# Patient Record
Sex: Female | Born: 1980 | Race: Asian | Hispanic: No | Marital: Married | Smoking: Former smoker
Health system: Southern US, Community
[De-identification: ages and names within clinical notes are randomized; demographics above are authoritative.]

## PROBLEM LIST (undated history)

## (undated) ENCOUNTER — Inpatient Hospital Stay (HOSPITAL_COMMUNITY): Payer: Self-pay

## (undated) DIAGNOSIS — Z789 Other specified health status: Secondary | ICD-10-CM

## (undated) HISTORY — PX: BREAST LUMPECTOMY: SHX2

---

## 1998-04-20 ENCOUNTER — Inpatient Hospital Stay (HOSPITAL_COMMUNITY): Admission: AD | Admit: 1998-04-20 | Discharge: 1998-04-20 | Payer: Self-pay | Admitting: Obstetrics

## 1998-12-04 ENCOUNTER — Other Ambulatory Visit: Admission: RE | Admit: 1998-12-04 | Discharge: 1998-12-04 | Payer: Self-pay | Admitting: Obstetrics

## 1998-12-04 ENCOUNTER — Ambulatory Visit (HOSPITAL_COMMUNITY): Admission: RE | Admit: 1998-12-04 | Discharge: 1998-12-04 | Payer: Self-pay | Admitting: Obstetrics

## 1999-01-01 ENCOUNTER — Other Ambulatory Visit: Admission: RE | Admit: 1999-01-01 | Discharge: 1999-01-01 | Payer: Self-pay | Admitting: Obstetrics

## 1999-01-01 ENCOUNTER — Inpatient Hospital Stay (HOSPITAL_COMMUNITY): Admission: AD | Admit: 1999-01-01 | Discharge: 1999-01-01 | Payer: Self-pay | Admitting: *Deleted

## 1999-03-24 ENCOUNTER — Inpatient Hospital Stay (HOSPITAL_COMMUNITY): Admission: AD | Admit: 1999-03-24 | Discharge: 1999-03-24 | Payer: Self-pay | Admitting: Obstetrics

## 1999-06-02 ENCOUNTER — Inpatient Hospital Stay (HOSPITAL_COMMUNITY): Admission: AD | Admit: 1999-06-02 | Discharge: 1999-06-02 | Payer: Self-pay | Admitting: Obstetrics

## 1999-06-03 ENCOUNTER — Inpatient Hospital Stay (HOSPITAL_COMMUNITY): Admission: AD | Admit: 1999-06-03 | Discharge: 1999-06-03 | Payer: Self-pay | Admitting: Obstetrics

## 1999-06-15 ENCOUNTER — Inpatient Hospital Stay (HOSPITAL_COMMUNITY): Admission: AD | Admit: 1999-06-15 | Discharge: 1999-06-15 | Payer: Self-pay | Admitting: Obstetrics

## 1999-06-26 ENCOUNTER — Inpatient Hospital Stay (HOSPITAL_COMMUNITY): Admission: AD | Admit: 1999-06-26 | Discharge: 1999-06-28 | Payer: Self-pay | Admitting: Obstetrics

## 1999-11-28 ENCOUNTER — Ambulatory Visit (HOSPITAL_BASED_OUTPATIENT_CLINIC_OR_DEPARTMENT_OTHER): Admission: RE | Admit: 1999-11-28 | Discharge: 1999-11-28 | Payer: Self-pay | Admitting: General Surgery

## 2002-06-27 ENCOUNTER — Encounter (HOSPITAL_BASED_OUTPATIENT_CLINIC_OR_DEPARTMENT_OTHER): Payer: Self-pay | Admitting: General Surgery

## 2002-06-29 ENCOUNTER — Ambulatory Visit (HOSPITAL_COMMUNITY): Admission: RE | Admit: 2002-06-29 | Discharge: 2002-06-29 | Payer: Self-pay | Admitting: General Surgery

## 2002-06-29 ENCOUNTER — Encounter (INDEPENDENT_AMBULATORY_CARE_PROVIDER_SITE_OTHER): Payer: Self-pay | Admitting: *Deleted

## 2004-05-18 ENCOUNTER — Inpatient Hospital Stay (HOSPITAL_COMMUNITY): Admission: AD | Admit: 2004-05-18 | Discharge: 2004-05-18 | Payer: Self-pay | Admitting: Obstetrics

## 2004-09-01 ENCOUNTER — Inpatient Hospital Stay (HOSPITAL_COMMUNITY): Admission: AD | Admit: 2004-09-01 | Discharge: 2004-09-05 | Payer: Self-pay | Admitting: Obstetrics

## 2004-09-02 ENCOUNTER — Encounter (INDEPENDENT_AMBULATORY_CARE_PROVIDER_SITE_OTHER): Payer: Self-pay | Admitting: Specialist

## 2004-09-06 ENCOUNTER — Encounter: Admission: RE | Admit: 2004-09-06 | Discharge: 2004-09-06 | Payer: Self-pay | Admitting: Obstetrics

## 2004-10-07 ENCOUNTER — Encounter: Admission: RE | Admit: 2004-10-07 | Discharge: 2004-11-06 | Payer: Self-pay | Admitting: Obstetrics

## 2004-11-07 ENCOUNTER — Encounter: Admission: RE | Admit: 2004-11-07 | Discharge: 2004-12-07 | Payer: Self-pay | Admitting: Obstetrics

## 2005-01-05 ENCOUNTER — Encounter: Admission: RE | Admit: 2005-01-05 | Discharge: 2005-02-04 | Payer: Self-pay | Admitting: Obstetrics

## 2005-03-07 ENCOUNTER — Encounter: Admission: RE | Admit: 2005-03-07 | Discharge: 2005-04-06 | Payer: Self-pay | Admitting: Obstetrics

## 2005-05-07 ENCOUNTER — Encounter: Admission: RE | Admit: 2005-05-07 | Discharge: 2005-06-06 | Payer: Self-pay | Admitting: Obstetrics

## 2005-06-07 ENCOUNTER — Encounter: Admission: RE | Admit: 2005-06-07 | Discharge: 2005-07-06 | Payer: Self-pay | Admitting: Obstetrics

## 2005-07-07 ENCOUNTER — Encounter: Admission: RE | Admit: 2005-07-07 | Discharge: 2005-08-06 | Payer: Self-pay | Admitting: Obstetrics

## 2005-08-07 ENCOUNTER — Encounter: Admission: RE | Admit: 2005-08-07 | Discharge: 2005-09-05 | Payer: Self-pay | Admitting: Obstetrics

## 2005-09-06 ENCOUNTER — Encounter: Admission: RE | Admit: 2005-09-06 | Discharge: 2005-10-06 | Payer: Self-pay | Admitting: Obstetrics

## 2005-10-07 ENCOUNTER — Encounter: Admission: RE | Admit: 2005-10-07 | Discharge: 2005-11-06 | Payer: Self-pay | Admitting: Obstetrics

## 2005-11-07 ENCOUNTER — Encounter: Admission: RE | Admit: 2005-11-07 | Discharge: 2005-12-04 | Payer: Self-pay | Admitting: Obstetrics

## 2005-12-05 ENCOUNTER — Encounter: Admission: RE | Admit: 2005-12-05 | Discharge: 2006-01-04 | Payer: Self-pay | Admitting: Obstetrics

## 2006-01-05 ENCOUNTER — Encounter: Admission: RE | Admit: 2006-01-05 | Discharge: 2006-02-04 | Payer: Self-pay | Admitting: Obstetrics

## 2008-07-22 ENCOUNTER — Encounter (INDEPENDENT_AMBULATORY_CARE_PROVIDER_SITE_OTHER): Payer: Self-pay | Admitting: Obstetrics

## 2008-07-22 ENCOUNTER — Encounter: Payer: Self-pay | Admitting: Emergency Medicine

## 2008-07-22 ENCOUNTER — Inpatient Hospital Stay (HOSPITAL_COMMUNITY): Admission: AD | Admit: 2008-07-22 | Discharge: 2008-07-23 | Payer: Self-pay | Admitting: Obstetrics

## 2009-09-06 ENCOUNTER — Ambulatory Visit (HOSPITAL_COMMUNITY): Admission: RE | Admit: 2009-09-06 | Discharge: 2009-09-06 | Payer: Self-pay | Admitting: Obstetrics

## 2009-09-11 ENCOUNTER — Inpatient Hospital Stay (HOSPITAL_COMMUNITY): Admission: AD | Admit: 2009-09-11 | Discharge: 2009-09-11 | Payer: Self-pay | Admitting: Obstetrics

## 2009-09-12 ENCOUNTER — Ambulatory Visit (HOSPITAL_COMMUNITY): Admission: RE | Admit: 2009-09-12 | Discharge: 2009-09-12 | Payer: Self-pay | Admitting: Obstetrics

## 2010-01-04 ENCOUNTER — Emergency Department: Payer: Self-pay | Admitting: Emergency Medicine

## 2010-10-25 ENCOUNTER — Other Ambulatory Visit (HOSPITAL_COMMUNITY): Payer: Self-pay | Admitting: Obstetrics

## 2010-10-25 DIAGNOSIS — O09299 Supervision of pregnancy with other poor reproductive or obstetric history, unspecified trimester: Secondary | ICD-10-CM

## 2010-10-26 ENCOUNTER — Encounter: Payer: Self-pay | Admitting: Obstetrics

## 2010-11-17 ENCOUNTER — Ambulatory Visit (HOSPITAL_COMMUNITY)
Admission: RE | Admit: 2010-11-17 | Discharge: 2010-11-17 | Disposition: A | Discharge status: Home / Self Care | Payer: BC Managed Care – PPO | Source: Ambulatory Visit | Attending: Obstetrics | Admitting: Obstetrics

## 2010-11-17 ENCOUNTER — Other Ambulatory Visit (HOSPITAL_COMMUNITY): Payer: Self-pay | Admitting: Obstetrics

## 2010-11-17 DIAGNOSIS — R58 Hemorrhage, not elsewhere classified: Secondary | ICD-10-CM

## 2010-11-17 DIAGNOSIS — O209 Hemorrhage in early pregnancy, unspecified: Secondary | ICD-10-CM | POA: Insufficient documentation

## 2010-11-17 DIAGNOSIS — Z3689 Encounter for other specified antenatal screening: Secondary | ICD-10-CM | POA: Insufficient documentation

## 2010-11-19 ENCOUNTER — Other Ambulatory Visit (HOSPITAL_COMMUNITY): Payer: BC Managed Care – PPO

## 2010-11-19 ENCOUNTER — Other Ambulatory Visit: Payer: Self-pay | Admitting: Obstetrics

## 2010-11-19 ENCOUNTER — Ambulatory Visit (HOSPITAL_COMMUNITY)
Admission: RE | Admit: 2010-11-19 | Discharge: 2010-11-19 | Disposition: A | Discharge status: Home / Self Care | Payer: BC Managed Care – PPO | Source: Ambulatory Visit | Attending: Obstetrics | Admitting: Obstetrics

## 2010-11-19 DIAGNOSIS — O021 Missed abortion: Secondary | ICD-10-CM | POA: Insufficient documentation

## 2010-11-19 LAB — CBC
HCT: 40.1 % (ref 36.0–46.0)
MCH: 30.8 pg (ref 26.0–34.0)
MCV: 90.7 fL (ref 78.0–100.0)
RBC: 4.42 MIL/uL (ref 3.87–5.11)
WBC: 6.5 10*3/uL (ref 4.0–10.5)

## 2010-11-25 ENCOUNTER — Other Ambulatory Visit (HOSPITAL_COMMUNITY): Payer: Self-pay

## 2010-11-25 ENCOUNTER — Encounter (HOSPITAL_COMMUNITY): Payer: Self-pay

## 2011-01-06 LAB — URINALYSIS, ROUTINE W REFLEX MICROSCOPIC
Bilirubin Urine: NEGATIVE
Glucose, UA: NEGATIVE mg/dL
Hgb urine dipstick: NEGATIVE
Specific Gravity, Urine: 1.02 (ref 1.005–1.030)

## 2011-02-04 NOTE — Op Note (Signed)
  Ashley Ware, Ashley Ware                ACCOUNT NO.:  [id number]  MEDICAL RECORD NO.:  [id number]           PATIENT TYPE:  O  LOCATION:  [facility]                          [facility]  PHYSICIAN:  Kathreen Cosier, M.D.DATE OF BIRTH:  [birth year]  DATE OF PROCEDURE:  11/19/2010 DATE OF DISCHARGE:  11/19/2010                              OPERATIVE REPORT   PREOPERATIVE DIAGNOSIS:  8-week intrauterine fetal demise.  POSTOPERATIVE DIAGNOSIS:  8-week intrauterine fetal demise.  PROCEDURE:  D and E using MAC.  The patient in lithotomy position.  The bladder was emptied with a straight catheter and perineum and vagina prepped.  Speculum placed in the vagina and cervix was injected with 10 mL of 1% Xylocaine.  Anterior lip of the cervix grasped with tenaculum and uterus was 10 weeks' size. A #10 suction device was easily placed in the uterine cavity and the contents of the uterus aspirated until the cavity was clean.  The patient tolerated the procedure well, taken to the recovery room in good condition.          ______________________________ Kathreen Cosier, M.D.     BAM/MEDQ  D:  01/28/2011  T:  01/28/2011  Job:  [id number]  Electronically Signed by Francoise Ceo M.D. on 02/04/2011 08:13:33 AM

## 2011-02-17 NOTE — Op Note (Signed)
NAMECLARISSIA, MCKEEN                ACCOUNT NO.:  [id number]   MEDICAL RECORD NO.:  [id number]          PATIENT TYPE:  INP   LOCATION:  9305                          [facility]   PHYSICIAN:  Kathreen Cosier, M.D.DATE OF BIRTH:  10/01/1981   DATE OF PROCEDURE:  07/22/2008  DATE OF DISCHARGE:                               OPERATIVE REPORT   PREOPERATIVE DIAGNOSIS:  Ruptured ectopic pregnancy.   POSTOPERATIVE DIAGNOSIS:  Ruptured left hemorrhagic ovarian cyst, corpus  luteum cyst.   PROCEDURE:  Wedge resection of the left ovary.   Under general anesthesia the patient in lithotomy position, abdomen,  perineum and vagina prepped and draped, bladder emptied with a Foley  catheter.  Transverse suprapubic mini lap was made carried down to the  fascia.  Fascia cleaned and incised length of incision.  Recti muscles  retracted laterally.  Peritoneum incised longitudinally with about 250  mL of free blood and clots in the peritoneal cavity.  The uterus was  soft and normal.  Both tubes were normal.  The right ovary was normal.  The left ovary was surrounded by clots and there was actively bleeding  from a defect in the ovarian wall.  A wedge resection was performed of  the left ovary and sent to pathology.  Hemostasis achieved with  interrupted sutures of 2-0 chromic in deep in the ovary and on the  capsule.  Hemostasis satisfactory.  Lap and sponge counts correct.  Abdomen closed in layers, peritoneum continuous suture of 0 chromic,  fascia continuous suture with Dexon.  Skin closed with subcuticular  stitch of 4-0 Monocryl.           ______________________________  Kathreen Cosier, M.D.     BAM/MEDQ  D:  07/22/2008  T:  07/22/2008  Job:  [id number]

## 2011-02-20 NOTE — Discharge Summary (Signed)
NAMENACOLE, FLUHR NO.:  [id number]   MEDICAL RECORD NO.:  [id number]          PATIENT TYPE:  INP   LOCATION:  9118                          [facility]   PHYSICIAN:  Kathreen Cosier, M.D.DATE OF BIRTH:  May 12, 1981   DATE OF ADMISSION:  09/01/2004  DATE OF DISCHARGE:                                 DISCHARGE SUMMARY   HOSPITAL COURSE:  The patient is a 30 year old gravida 3 para 2-0-0-2 with  [facility] October 14, 2004.  Started contracting 11:30 p.m. prior to admission and  was contracting every 2-3 minutes of good quality.  On admission, because  she was supposedly 34 weeks, she was started on magnesium sulfate 4 g  loading, 2 g per hour.  By 7:30 a.m. the patient stated that she had some  leakage and a speculum exam confirmed ruptured membranes.  The fluid was  clear.  The cervix was 2 cm, 70%, vertex, -3.  The estimated fetal weight  was 6 pounds.  She got betamethasone 12.5 IM x1 and the patient was in  active labor.  Throughout the course of the day she progressed slowly and by  3:40 p.m. on November 29 she was 6 cm, 70%, with the vertex -2 to -3 station  and her labor was augmented with Pitocin.  However, the patient by 9:10 on  the following day had not changed her cervix although she had adequate MVUs.  It was decided that she would be delivered by C-section for failure to  progress in labor, being unchanged for greater than 10 hours with adequate  labor.  She had a low transverse cesarean section and had a female, Apgars 8  and 9, weighing 6 pounds 5 ounces.  Postoperatively, she did well.  Her  hemoglobin was 10.4, and she was discharged home on postoperative day #3  ambulatory, on a regular diet, to see me in 6 weeks.   DISCHARGE DIAGNOSIS:  Status post primary low transverse cesarean section  for failure to progress in labor.      BAM/MEDQ  D:  09/05/2004  T:  09/05/2004  Job:  [id number]

## 2011-02-20 NOTE — Op Note (Signed)
[facility]  Patient:    Ashley Ware, Ashley Ware                         MRN: [id number] Proc. Date: 11/28/99 Adm. Date:  [id number] Attending:  Fortino Sic                           Operative Report  PREOPERATIVE DIAGNOSIS:  Large abscess left axilla.  POSTOPERATIVE DIAGNOSIS:  Large abscess left axilla.  PROCEDURE: Irrigation and debridement large abscess left axilla.  SURGEON:  Marnee Spring. Wiliam Ke, M.D.  ASSISTANT:  None.  ANESTHESIA:  Local MAC.  PROCEDURE:  With the patient well-sedated, the skin of the left axilla was prepped and draped in usual manner.  Tissues were infiltrated with Xylocaine anesthesia. An incision was made over the entire abscess cavity approximately 6 cm long. Lots of pus-like fluid and necrotic tissue was evacuated.  The wound was irrigated with saline and peroxide.  Hemostasis was obtained with electrocautery current.  The  wound was then packed with 1 inch Iodoform gauze and dressing was applied. Estimated blood loss minimal.  Patient received no blood and left the operating  room in stable condition after sponge and needle counts were verified. DD:  11/28/99 TD:  11/29/99 Job: [id number]

## 2011-02-20 NOTE — Op Note (Signed)
Ashley Ware, Ashley Ware                ACCOUNT NO.:  [id number]   MEDICAL RECORD NO.:  [id number]          PATIENT TYPE:  INP   LOCATION:  9118                          [facility]   PHYSICIAN:  Kathreen Cosier, M.D.DATE OF BIRTH:  Jul 23, 1981   DATE OF PROCEDURE:  09/02/2004  DATE OF DISCHARGE:                                 OPERATIVE REPORT   PREOPERATIVE DIAGNOSIS:  Prolonged ruptured membranes with failure to  progress in labor.   POSTOPERATIVE DIAGNOSIS:  Prolonged ruptured membranes with failure to  progress in labor.   SURGEON:  Kathreen Cosier, M.D.   ANESTHESIA:  Epidural.   DESCRIPTION OF PROCEDURE:  The patient was placed on the operating table in  the supine position.  The abdomen was prepped and draped.  The bladder was  emptied with a Foley catheter.   A transverse suprapubic incision was made and carried down to the rectus  fascia.  The fascia was cleaned and incised the length of the incision.  The  recti muscles were retracted laterally.  The peritoneum was incised  longitudinally.  A transverse incision was made in the visceral peritoneum  above the bladder.  The bladder was then mobilized inferiorly.  A transverse  lower uterine incision was made.  The patient was delivered from the OP  position of a female, Apgars 8 and 9, with a loose nuchal cord which was  reduced.  The team was in attendance.  The baby weighted 6 pounds 5 ounces.  Apgars were 8 and 9.  The placenta was posterior and removed manually.  The  uterine cavity was cleaned and dried with a dry lap.  The incision was  closed in one layer with continuous suture of #1 chromic.  Hemostasis was  satisfactory.  The bladder flap was reattached.  The uterus well-contracted.  The tubes and ovaries were normal.  The abdomen was closed in layers.  The  peritoneum with continuous suture of 0 chromic, the fascia with continuous  suture of 0 Dexon, and the skin was closed with subcuticular stitch of  4-0  Monocryl.  Estimated blood loss was 500 cc.      BAM/MEDQ  D:  09/02/2004  T:  09/02/2004  Job:  [id number]

## 2011-02-20 NOTE — Op Note (Signed)
   NAMEJAZZALYNN, Ashley Ware                            ACCOUNT NO.:  [id number]   MEDICAL RECORD NO.:  [id number]                   PATIENT TYPE:  OIB   LOCATION:  2899                                 FACILITY:  [facility]   PHYSICIAN:  Mardene Celeste. Lurene Shadow, M.D.             DATE OF BIRTH:  Apr 06, 1981   DATE OF PROCEDURE:  06/29/2002  DATE OF DISCHARGE:                                 OPERATIVE REPORT   PREOPERATIVE DIAGNOSES:  Hidradenitis suppurativa of right axilla with  abscess.   POSTOPERATIVE DIAGNOSES:  Hidradenitis suppurativa of right axilla with  abscess.   OPERATION PERFORMED:  Excision of hidradenitis suppurativa of right axilla.   SURGEON:  Mardene Celeste. Lurene Shadow, M.D.   ASSISTANT:  Nurse.   ANESTHESIA:  General.   INDICATIONS FOR PROCEDURE:  The patient is a 30 year old woman presenting  with hidradenitis of the right axilla.  She comes to surgery now after  conservative management with antibiotics had been unsuccessful.  She is  aware of the risks and potential benefits of surgery and all questions  answered and consent obtained.   DESCRIPTION OF PROCEDURE:  Following the induction of satisfactory general  anesthesia with the patient positioned supinely, right arm extended  laterally, the right axilla was prepped and draped to be included in a  sterile operative field.  The area around the hidradenitis was encircled  with a large elliptical incision which was then deepened down through the  skin and subcutaneous tissues to dissect the entire axilla with the  underlying subcutaneous margin to remove the area of the hidradenitis.  Hemostasis was then obtained with electrocautery while the specimen was  removed and forwarded for pathologic evaluation.  The subcutaneous tissues  were then closed with a running 3-0 Vicryl suture and skin closed with  running 4-0 Monocryl suture and then reinforced with Steri-Strips.  Sterile  dressings applied.  Anesthetic was reversed and the  patient removed from the  operating room to the recovery room in stable condition, having tolerated  the procedure well.                                                 Mardene Celeste Lurene Shadow, M.D.    PLB/MEDQ  D:  06/29/2002  T:  06/29/2002  Job:  [id number]

## 2011-02-20 NOTE — H&P (Signed)
Ashley Ware, Ashley Ware                ACCOUNT NO.:  [id number]   MEDICAL RECORD NO.:  [id number]          PATIENT TYPE:  INP   LOCATION:  9118                          [facility]   PHYSICIAN:  Kathreen Cosier, M.D.DATE OF BIRTH:  1980-12-12   DATE OF ADMISSION:  09/01/2004  DATE OF DISCHARGE:                                HISTORY & PHYSICAL   HISTORY OF PRESENT ILLNESS:  The patient is a 30 year old gravida 3, para 2-  0-2, with EDC between January 6 and October 14, 2004.  She started  contracting at 11:30 a.m. on the night of August 31, 2004 and came to the  hospital on the morning of September 01, 2004 at which time she was started  on magnesium sulfate, 4 g, loading 2 g per hour.  The patient continued  contracting every two to three minutes.  At 7:30 a.m., speculum exam was  performed because the suspicion of possible ruptured membranes, and ruptured  membranes were confirmed.  The fluid was clear.  The cervix was 2 cm, 70%,  with the vertex at -3 station.  The patient got betamethasone 12.5 IM x1,  and she was in labor.  The estimated fetal weight was 6 pounds clinically.  It was decided that she would continue laboring, and she got Pitocin  stimulation.  An IUPC was inserted, and an epidural by 6:53 p.m. on September 01, 2004.  At 6:53 p.m., the cervix was 4 cm, 70%, and the vertex was at -3.  She was a good pattern __________ greater than 200.  At 3:40 a.m., she was 6  cm, 70%, and the vertex was at -2 to -3 station.  The patient did not make  any more progress.  At 9:10 _________, the cervix was unchanged, and it was  decided that she would deliver by C-section for failure to progress in  labor.   PHYSICAL EXAMINATION:  GENERAL:  Well-developed female in labor.  HEENT:  Negative.  LUNGS:  Clear.  HEART:  Regular rhythm.  No murmurs or gallops.  ABDOMEN:  36-weeks size, estimated fetal weight 6 pounds.  PELVIS:  As described.  EXTREMITIES:  Negative.      BAM/MEDQ  D:  09/02/2004  T:  09/02/2004  Job:  [id number]

## 2011-06-03 ENCOUNTER — Other Ambulatory Visit (HOSPITAL_COMMUNITY): Payer: Self-pay | Admitting: Obstetrics

## 2011-06-03 DIAGNOSIS — O3680X0 Pregnancy with inconclusive fetal viability, not applicable or unspecified: Secondary | ICD-10-CM

## 2011-06-05 ENCOUNTER — Ambulatory Visit (HOSPITAL_COMMUNITY)
Admission: RE | Admit: 2011-06-05 | Discharge: 2011-06-05 | Disposition: A | Discharge status: Home / Self Care | Payer: BC Managed Care – PPO | Source: Ambulatory Visit | Attending: Obstetrics | Admitting: Obstetrics

## 2011-06-05 ENCOUNTER — Ambulatory Visit (HOSPITAL_COMMUNITY): Payer: BC Managed Care – PPO

## 2011-06-05 DIAGNOSIS — Z3689 Encounter for other specified antenatal screening: Secondary | ICD-10-CM | POA: Insufficient documentation

## 2011-06-05 DIAGNOSIS — O09299 Supervision of pregnancy with other poor reproductive or obstetric history, unspecified trimester: Secondary | ICD-10-CM | POA: Insufficient documentation

## 2011-06-05 DIAGNOSIS — O3680X0 Pregnancy with inconclusive fetal viability, not applicable or unspecified: Secondary | ICD-10-CM

## 2011-06-16 DIAGNOSIS — O4700 False labor before 37 completed weeks of gestation, unspecified trimester: Secondary | ICD-10-CM

## 2011-07-06 LAB — CBC
MCHC: 33.8
MCHC: 34.1
MCV: 93.1
MCV: 94.5
RBC: 2.96 — ABNORMAL LOW
RDW: 12.7

## 2011-07-06 LAB — SAMPLE TO BLOOD BANK

## 2011-07-07 LAB — CBC
Hemoglobin: 12.8
MCHC: 34
MCV: 93.2
RBC: 4.04
RDW: 12.2

## 2011-07-07 LAB — URINALYSIS, ROUTINE W REFLEX MICROSCOPIC
Glucose, UA: NEGATIVE
Ketones, ur: 15 — AB
Protein, ur: NEGATIVE
Urobilinogen, UA: 0.2

## 2011-07-07 LAB — APTT: aPTT: 26

## 2011-07-07 LAB — COMPREHENSIVE METABOLIC PANEL
CO2: 21
Calcium: 9.3
Creatinine, Ser: 0.67
GFR calc Af Amer: >60
GFR calc non Af Amer: >60
Glucose, Bld: 120 — ABNORMAL HIGH
Total Protein: 6.6

## 2011-07-07 LAB — PROTIME-INR
INR: 1
Prothrombin Time: 13.8

## 2011-07-07 LAB — DIFFERENTIAL
Lymphocytes Relative: 9 — ABNORMAL LOW
Lymphs Abs: 1.5
Neutrophils Relative %: 88 — ABNORMAL HIGH

## 2011-07-07 LAB — HCG, QUANTITATIVE, PREGNANCY: hCG, Beta Chain, Quant, S: 3300 — ABNORMAL HIGH

## 2011-07-07 LAB — LIPASE, BLOOD: Lipase: 16

## 2011-07-29 ENCOUNTER — Other Ambulatory Visit (HOSPITAL_COMMUNITY): Payer: Self-pay | Admitting: Obstetrics

## 2011-07-29 DIAGNOSIS — Z3689 Encounter for other specified antenatal screening: Secondary | ICD-10-CM

## 2011-07-31 ENCOUNTER — Other Ambulatory Visit (HOSPITAL_COMMUNITY): Payer: Self-pay | Admitting: Obstetrics

## 2011-07-31 ENCOUNTER — Ambulatory Visit (HOSPITAL_COMMUNITY)
Admission: RE | Admit: 2011-07-31 | Discharge: 2011-07-31 | Disposition: A | Discharge status: Home / Self Care | Payer: BC Managed Care – PPO | Source: Ambulatory Visit | Attending: Obstetrics | Admitting: Obstetrics

## 2011-07-31 DIAGNOSIS — Z363 Encounter for antenatal screening for malformations: Secondary | ICD-10-CM | POA: Insufficient documentation

## 2011-07-31 DIAGNOSIS — Z1389 Encounter for screening for other disorder: Secondary | ICD-10-CM | POA: Insufficient documentation

## 2011-07-31 DIAGNOSIS — O358XX0 Maternal care for other (suspected) fetal abnormality and damage, not applicable or unspecified: Secondary | ICD-10-CM | POA: Insufficient documentation

## 2011-07-31 DIAGNOSIS — Z3689 Encounter for other specified antenatal screening: Secondary | ICD-10-CM

## 2011-07-31 DIAGNOSIS — O352XX0 Maternal care for (suspected) hereditary disease in fetus, not applicable or unspecified: Secondary | ICD-10-CM | POA: Insufficient documentation

## 2011-10-06 NOTE — L&D Delivery Note (Signed)
Delivery Note At 5:18 AM a viable female was delivered via Vaginal, Spontaneous Delivery (Presentation: Right Occiput Anterior).  APGAR: 8, 9; weight .   Placenta status: Intact, Manual removal.  Cord: 3 vessels with the following complications: None.  Cord pH: not done  Anesthesia: Epidural  Episiotomy: None Lacerations:  Suture Repair: 2.0 Est. Blood Loss (mL):   Mom to postpartum.  Baby to nursery-stable.  Ashley Ware A 11/11/2011, 5:32 AM

## 2011-10-21 ENCOUNTER — Inpatient Hospital Stay (HOSPITAL_COMMUNITY)
Admission: AD | Admit: 2011-10-21 | Discharge: 2011-10-21 | Disposition: A | Discharge status: Home / Self Care | Payer: Self-pay | Source: Ambulatory Visit | Attending: Obstetrics | Admitting: Obstetrics

## 2011-10-21 ENCOUNTER — Other Ambulatory Visit (HOSPITAL_COMMUNITY): Payer: Self-pay | Admitting: Obstetrics

## 2011-10-21 ENCOUNTER — Encounter (HOSPITAL_COMMUNITY): Payer: Self-pay | Admitting: *Deleted

## 2011-10-21 DIAGNOSIS — O479 False labor, unspecified: Secondary | ICD-10-CM

## 2011-10-21 DIAGNOSIS — O4700 False labor before 37 completed weeks of gestation, unspecified trimester: Secondary | ICD-10-CM | POA: Insufficient documentation

## 2011-10-21 HISTORY — DX: Other specified health status: Z78.9

## 2011-10-21 LAB — URINALYSIS, ROUTINE W REFLEX MICROSCOPIC
Bilirubin Urine: NEGATIVE
Ketones, ur: NEGATIVE mg/dL
Nitrite: NEGATIVE
Protein, ur: NEGATIVE mg/dL
Urobilinogen, UA: 0.2 mg/dL (ref 0.0–1.0)
pH: 6.5 (ref 5.0–8.0)

## 2011-10-21 LAB — WET PREP, GENITAL: Trich, Wet Prep: NONE SEEN

## 2011-10-21 MED ORDER — TERBUTALINE SULFATE 1 MG/ML IJ SOLN
0.2500 mg | Freq: Once | INTRAMUSCULAR | Status: AC
  Administered 2011-10-21: 0.25 mg via SUBCUTANEOUS
  Filled 2011-10-21: qty 1

## 2011-10-21 NOTE — Progress Notes (Signed)
Pt in c/o contractions every 5 minutes since 2030.  Denies any bleeding or leaking of fluid.  Reports some mucus discharge.  + FM.

## 2011-10-21 NOTE — ED Provider Notes (Signed)
History     Chief Complaint  Patient presents with  . Contractions   HPI 31 y.o. [id number] at [redacted]w[redacted]d c/o contractions x 2 hours, q 5 min, no bleeding or LOF. H/O PTB with last pregnancy at 35 weeks.   Past Medical History  Diagnosis Date  . No pertinent past medical history     Past Surgical History  Procedure Date  . Breast lumpectomy   . Cesarean section     Family History  Problem Relation Age of Onset  . Adopted: Yes    History  Substance Use Topics  . Smoking status: Never Smoker   . Smokeless tobacco: Never Used  . Alcohol Use: No    Allergies: No Known Allergies  Prescriptions prior to admission  Medication Sig Dispense Refill  . Prenatal Vit-Fe Fumarate-FA (PRENATAL MULTIVITAMIN) TABS Take 1 tablet by mouth daily.        Review of Systems  Constitutional: Negative.   Respiratory: Negative.   Cardiovascular: Negative.   Gastrointestinal: Negative for nausea, vomiting, abdominal pain, diarrhea and constipation.  Genitourinary: Negative for dysuria, urgency, frequency, hematuria and flank pain.       Negative for vaginal bleeding, Positive for contractions  Musculoskeletal: Negative.   Neurological: Negative.   Psychiatric/Behavioral: Negative.    Physical Exam   Blood pressure 133/80, pulse 113, temperature 98.4 F (36.9 C), resp. rate 18, SpO2 93.00%.  Physical Exam  Nursing note and vitals reviewed. Constitutional: She is oriented to person, place, and time. She appears well-developed and well-nourished. She appears distressed (uncomfortable appearing).  Cardiovascular: Normal rate.   Respiratory: Effort normal.  GI: Soft. There is no tenderness.  Genitourinary: Vaginal discharge (white) found.       SVE: ext os 3/int os 2/long/-3/firm  Neurological: She is alert and oriented to person, place, and time.  Skin: Skin is warm and dry.  Psychiatric: She has a normal mood and affect.    MAU Course  Procedures  Results for orders placed during  the hospital encounter of 10/21/11 (from the past 24 hour(s))  URINALYSIS, ROUTINE W REFLEX MICROSCOPIC     Status: Abnormal   Collection Time   10/21/11  9:37 PM      Component Value Range   Color, Urine YELLOW  YELLOW    APPearance CLEAR  CLEAR    Specific Gravity, Urine 1.015  1.005 - 1.030    pH 6.5  5.0 - 8.0    Glucose, UA 500 (*) NEGATIVE (mg/dL)   Hgb urine dipstick NEGATIVE  NEGATIVE    Bilirubin Urine NEGATIVE  NEGATIVE    Ketones, ur NEGATIVE  NEGATIVE (mg/dL)   Protein, ur NEGATIVE  NEGATIVE (mg/dL)   Urobilinogen, UA 0.2  0.0 - 1.0 (mg/dL)   Nitrite NEGATIVE  NEGATIVE    Leukocytes, UA NEGATIVE  NEGATIVE   FETAL FIBRONECTIN     Status: Normal   Collection Time   10/21/11 10:30 PM      Component Value Range   Fetal Fibronectin NEGATIVE  NEGATIVE   WET PREP, GENITAL     Status: Abnormal   Collection Time   10/21/11 10:30 PM      Component Value Range   Yeast, Wet Prep NONE SEEN  NONE SEEN    Trich, Wet Prep NONE SEEN  NONE SEEN    Clue Cells, Wet Prep NONE SEEN  NONE SEEN    WBC, Wet Prep HPF POC FEW (*) NONE SEEN     Terbutaline 0.25 [place] Assessment and Plan  31 y.o. Z6X0960 at [redacted]w[redacted]d Threatened preterm labor - no signs of active labor, contractions stopped after terb, FFN negative F/U in office as scheduled, precautions rev'd  Kobe Ofallon 10/21/2011, 11:32 PM

## 2011-10-21 NOTE — Progress Notes (Signed)
Pt reports she has been having contractions, have been q 5 minutes x 2 hours. Some pressure. Denies other symptoms.

## 2011-10-22 LAB — GC/CHLAMYDIA PROBE AMP, GENITAL
Chlamydia, DNA Probe: NEGATIVE
GC Probe Amp, Genital: NEGATIVE

## 2011-10-23 ENCOUNTER — Other Ambulatory Visit (HOSPITAL_COMMUNITY): Payer: Self-pay | Admitting: Obstetrics

## 2011-10-23 ENCOUNTER — Ambulatory Visit (HOSPITAL_COMMUNITY)
Admission: RE | Admit: 2011-10-23 | Discharge: 2011-10-23 | Disposition: A | Discharge status: Home / Self Care | Payer: BC Managed Care – PPO | Source: Ambulatory Visit | Attending: Obstetrics | Admitting: Obstetrics

## 2011-10-23 DIAGNOSIS — Z3689 Encounter for other specified antenatal screening: Secondary | ICD-10-CM | POA: Insufficient documentation

## 2011-10-23 DIAGNOSIS — O3660X0 Maternal care for excessive fetal growth, unspecified trimester, not applicable or unspecified: Secondary | ICD-10-CM | POA: Insufficient documentation

## 2011-10-28 ENCOUNTER — Other Ambulatory Visit (HOSPITAL_COMMUNITY): Payer: Self-pay | Admitting: Obstetrics

## 2011-10-30 ENCOUNTER — Inpatient Hospital Stay (HOSPITAL_COMMUNITY)
Admission: AD | Admit: 2011-10-30 | Discharge: 2011-10-30 | Disposition: A | Discharge status: Home / Self Care | Payer: BC Managed Care – PPO | Source: Ambulatory Visit | Attending: Obstetrics | Admitting: Obstetrics

## 2011-10-30 ENCOUNTER — Encounter (HOSPITAL_COMMUNITY): Payer: Self-pay | Admitting: *Deleted

## 2011-10-30 DIAGNOSIS — O4700 False labor before 37 completed weeks of gestation, unspecified trimester: Secondary | ICD-10-CM | POA: Insufficient documentation

## 2011-10-30 DIAGNOSIS — O479 False labor, unspecified: Secondary | ICD-10-CM

## 2011-10-30 MED ORDER — HYDROXYZINE PAMOATE 50 MG PO CAPS: 50.0000 mg | ORAL_CAPSULE | Freq: Three times a day (TID) | ORAL | Status: DC | PRN

## 2011-10-30 NOTE — ED Provider Notes (Signed)
History   Pt presents today c/o ctx. She states she has had similar sx in the past and had a negative fetal fibronectin on 10/21/11. She reports GFM and denies vag dc or bleeding.  Chief Complaint  Patient presents with  . Contractions   HPI  OB History    Grav Para Term Preterm Abortions TAB SAB Ect Mult Living   5 3 2 1 1  0 1 0 0 3      Past Medical History  Diagnosis Date  . No pertinent past medical history     Past Surgical History  Procedure Date  . Breast lumpectomy   . Cesarean section     Family History  Problem Relation Age of Onset  . Adopted: Yes    History  Substance Use Topics  . Smoking status: Never Smoker   . Smokeless tobacco: Never Used  . Alcohol Use: No    Allergies: No Known Allergies  Prescriptions prior to admission  Medication Sig Dispense Refill  . Prenatal Vit-Fe Fumarate-FA (PRENATAL MULTIVITAMIN) TABS Take 1 tablet by mouth daily.        Review of Systems  Constitutional: Negative for fever.  Eyes: Negative for blurred vision and double vision.  Cardiovascular: Negative for chest pain.  Gastrointestinal: Positive for abdominal pain. Negative for nausea, vomiting, diarrhea and constipation.  Genitourinary: Negative for dysuria, urgency, frequency and hematuria.  Neurological: Negative for dizziness and headaches.  Psychiatric/Behavioral: Negative for depression and suicidal ideas.   Physical Exam   Blood pressure 124/73, pulse 100, temperature 97.4 F (36.3 C), temperature source Oral, resp. rate 20, height 5\' 3"  (1.6 m), weight 220 lb (99.791 kg), SpO2 97.00%.  Physical Exam  Nursing note and vitals reviewed. Constitutional: She is oriented to person, place, and time. She appears well-developed and well-nourished. No distress.  HENT:  Head: Normocephalic and atraumatic.  GI: Soft. She exhibits no distension. There is no tenderness. There is no rebound and no guarding.  Genitourinary: No bleeding around the vagina. No vaginal  discharge found.       Cervix unchanged. 2/50/-3. Fetus is vertex.  Neurological: She is alert and oriented to person, place, and time.  Skin: Skin is warm and dry. She is not diaphoretic.  Psychiatric: She has a normal mood and affect. Her behavior is normal. Judgment and thought content normal.    MAU Course  Procedures  NST reactive with some irritability.   Assessment and Plan  Braxton Hicks: pt has had no cervical change and is not actively contracting. She has f/u scheduled. Discussed signs and sx of preterm labor. Discussed diet, activity, risks, and precautions. Reminded of [facility].  Clinton Gallant. Emberlee Sortino III, DrHSc, MPAS, PA-C  10/30/2011, 11:38 AM   Henrietta Hoover, PA 10/30/11 1218

## 2011-10-30 NOTE — Progress Notes (Signed)
Patient states she is having contractions every 4 minutes, no bleeding or leaking and reports good fetal movement.

## 2011-11-07 ENCOUNTER — Inpatient Hospital Stay (HOSPITAL_COMMUNITY)
Admission: AD | Admit: 2011-11-07 | Discharge: 2011-11-08 | Disposition: A | Discharge status: Home / Self Care | Payer: BC Managed Care – PPO | Source: Ambulatory Visit | Attending: Obstetrics | Admitting: Obstetrics

## 2011-11-07 ENCOUNTER — Encounter (HOSPITAL_COMMUNITY): Payer: Self-pay | Admitting: *Deleted

## 2011-11-07 DIAGNOSIS — O479 False labor, unspecified: Secondary | ICD-10-CM

## 2011-11-07 DIAGNOSIS — O4700 False labor before 37 completed weeks of gestation, unspecified trimester: Secondary | ICD-10-CM | POA: Insufficient documentation

## 2011-11-07 NOTE — Progress Notes (Signed)
Pt reports having ctx since 8pm q 3 min. Denies bleeding or leaking at this time. Reports fetal movement less since ctx stared. Pt has history of PTL and delivery.

## 2011-11-07 NOTE — Progress Notes (Signed)
States has been leaking some fld since about 2000 but unsure if is urine or amniotic fld

## 2011-11-07 NOTE — ED Notes (Signed)
Threasa Heads CNM in to see pt. EFM strip reviewed.

## 2011-11-07 NOTE — Progress Notes (Signed)
Pt does not want to walk.

## 2011-11-07 NOTE — ED Notes (Signed)
Pt states has not felt as much FM today as normal

## 2011-11-07 NOTE — Progress Notes (Signed)
Threasa Heads  CNM in to see pt. Spec exam done to r/o srom. Pt tol well

## 2011-11-07 NOTE — Progress Notes (Signed)
To R side 

## 2011-11-07 NOTE — Progress Notes (Signed)
Threasa Heads CNM in to reck pt

## 2011-11-08 MED ORDER — BUTORPHANOL TARTRATE 2 MG/ML IJ SOLN
1.0000 mg | Freq: Once | INTRAMUSCULAR | Status: AC
  Administered 2011-11-08: 1 mg via INTRAMUSCULAR
  Filled 2011-11-08: qty 1

## 2011-11-08 NOTE — Progress Notes (Signed)
Pt requests sve before d/c to home. CNM in to see pt.and reck.

## 2011-11-08 NOTE — Progress Notes (Signed)
Written and verbal d/c instructions given and understanding voiced. Reminded not to drive after taking pain med tonight

## 2011-11-08 NOTE — Progress Notes (Signed)
Pt lives in [place] and not comfortable going home yet. Will walk for an hour and then reck cervix. Threasa Heads CNM in with pt

## 2011-11-10 ENCOUNTER — Encounter (HOSPITAL_COMMUNITY): Payer: Self-pay | Admitting: Anesthesiology

## 2011-11-10 ENCOUNTER — Inpatient Hospital Stay (HOSPITAL_COMMUNITY): Payer: BC Managed Care – PPO | Admitting: Anesthesiology

## 2011-11-10 ENCOUNTER — Inpatient Hospital Stay (HOSPITAL_COMMUNITY)
Admission: AD | Admit: 2011-11-10 | Discharge: 2011-11-13 | DRG: 373 | Disposition: A | Discharge status: Home / Self Care | Payer: BC Managed Care – PPO | Source: Ambulatory Visit | Attending: Obstetrics | Admitting: Obstetrics

## 2011-11-10 ENCOUNTER — Encounter (HOSPITAL_COMMUNITY): Payer: Self-pay | Admitting: *Deleted

## 2011-11-10 LAB — CBC
HCT: 37.5 % (ref 36.0–46.0)
Hemoglobin: 12.3 g/dL (ref 12.0–15.0)
MCH: 29.1 pg (ref 26.0–34.0)
MCV: 88.9 fL (ref 78.0–100.0)
RBC: 4.22 MIL/uL (ref 3.87–5.11)

## 2011-11-10 LAB — STREP B DNA PROBE

## 2011-11-10 MED ORDER — TERBUTALINE SULFATE 1 MG/ML IJ SOLN: 0.2500 mg | Freq: Once | INTRAMUSCULAR | Status: AC | PRN

## 2011-11-10 MED ORDER — LACTATED RINGERS IV SOLN
500.0000 mL | Freq: Once | INTRAVENOUS | Status: AC
  Administered 2011-11-10: 500 mL via INTRAVENOUS

## 2011-11-10 MED ORDER — LIDOCAINE HCL 1.5 % IJ SOLN
INTRAMUSCULAR | Status: DC | PRN
  Administered 2011-11-10 (×2): 5 mL via EPIDURAL

## 2011-11-10 MED ORDER — EPHEDRINE 5 MG/ML INJ: 10.0000 mg | INTRAVENOUS | Status: DC | PRN

## 2011-11-10 MED ORDER — BUTORPHANOL TARTRATE 2 MG/ML IJ SOLN: 1.0000 mg | INTRAMUSCULAR | Status: DC | PRN

## 2011-11-10 MED ORDER — OXYTOCIN 20 UNITS IN LACTATED RINGERS INFUSION - SIMPLE: 125.0000 mL/h | Freq: Once | INTRAVENOUS | Status: DC

## 2011-11-10 MED ORDER — ONDANSETRON HCL 4 MG/2ML IJ SOLN: 4.0000 mg | Freq: Four times a day (QID) | INTRAMUSCULAR | Status: DC | PRN

## 2011-11-10 MED ORDER — LACTATED RINGERS IV SOLN: 500.0000 mL | INTRAVENOUS | Status: DC | PRN

## 2011-11-10 MED ORDER — SODIUM CHLORIDE 0.9 % IV SOLN
2.0000 g | Freq: Four times a day (QID) | INTRAVENOUS | Status: DC
  Administered 2011-11-10: 2 g via INTRAVENOUS
  Filled 2011-11-10 (×4): qty 2000

## 2011-11-10 MED ORDER — OXYCODONE-ACETAMINOPHEN 5-325 MG PO TABS
1.0000 | ORAL_TABLET | ORAL | Status: DC | PRN
  Administered 2011-11-12 – 2011-11-13 (×7): 1 via ORAL
  Filled 2011-11-10 (×6): qty 1

## 2011-11-10 MED ORDER — OXYTOCIN 20 UNITS IN LACTATED RINGERS INFUSION - SIMPLE
1.0000 m[IU]/min | INTRAVENOUS | Status: DC
  Administered 2011-11-10: 1 m[IU]/min via INTRAVENOUS
  Filled 2011-11-10: qty 1000

## 2011-11-10 MED ORDER — PHENYLEPHRINE 40 MCG/ML (10ML) SYRINGE FOR IV PUSH (FOR BLOOD PRESSURE SUPPORT)
80.0000 ug | PREFILLED_SYRINGE | INTRAVENOUS | Status: DC | PRN
  Filled 2011-11-10: qty 5

## 2011-11-10 MED ORDER — IBUPROFEN 600 MG PO TABS: 600.0000 mg | ORAL_TABLET | Freq: Four times a day (QID) | ORAL | Status: DC | PRN

## 2011-11-10 MED ORDER — DIPHENHYDRAMINE HCL 50 MG/ML IJ SOLN: 12.5000 mg | INTRAMUSCULAR | Status: DC | PRN

## 2011-11-10 MED ORDER — OXYTOCIN BOLUS FROM INFUSION
500.0000 mL | Freq: Once | INTRAVENOUS | Status: DC
  Filled 2011-11-10: qty 500

## 2011-11-10 MED ORDER — FENTANYL 2.5 MCG/ML BUPIVACAINE 1/10 % EPIDURAL INFUSION (WH - ANES)
INTRAMUSCULAR | Status: DC | PRN
  Administered 2011-11-10: 14 mL/h via EPIDURAL

## 2011-11-10 MED ORDER — ACETAMINOPHEN 325 MG PO TABS: 650.0000 mg | ORAL_TABLET | ORAL | Status: DC | PRN

## 2011-11-10 MED ORDER — LIDOCAINE HCL (PF) 1 % IJ SOLN
30.0000 mL | INTRAMUSCULAR | Status: DC | PRN
  Filled 2011-11-10: qty 30

## 2011-11-10 MED ORDER — LACTATED RINGERS IV SOLN
INTRAVENOUS | Status: DC
  Administered 2011-11-10: 18:00:00 via INTRAVENOUS
  Administered 2011-11-11: 125 mL/h via INTRAVENOUS

## 2011-11-10 MED ORDER — FLEET ENEMA 7-19 GM/118ML RE ENEM: 1.0000 | ENEMA | RECTAL | Status: DC | PRN

## 2011-11-10 MED ORDER — FENTANYL 2.5 MCG/ML BUPIVACAINE 1/10 % EPIDURAL INFUSION (WH - ANES)
14.0000 mL/h | INTRAMUSCULAR | Status: DC
  Administered 2011-11-11 (×3): 14 mL/h via EPIDURAL
  Filled 2011-11-10 (×5): qty 60

## 2011-11-10 MED ORDER — PHENYLEPHRINE 40 MCG/ML (10ML) SYRINGE FOR IV PUSH (FOR BLOOD PRESSURE SUPPORT)
80.0000 ug | PREFILLED_SYRINGE | INTRAVENOUS | Status: DC | PRN
Start: 2011-11-10 — End: 2011-11-11

## 2011-11-10 MED ORDER — CITRIC ACID-SODIUM CITRATE 334-500 MG/5ML PO SOLN: 30.0000 mL | ORAL | Status: DC | PRN

## 2011-11-10 MED ORDER — EPHEDRINE 5 MG/ML INJ
10.0000 mg | INTRAVENOUS | Status: DC | PRN
  Filled 2011-11-10: qty 4

## 2011-11-10 NOTE — Anesthesia Procedure Notes (Signed)
Epidural Patient location during procedure: OB Start time: 11/10/2011 5:43 AM End time: 11/10/2011 5:48 AM Reason for block: procedure for pain  Staffing Anesthesiologist: Sandrea Hughs Performed by: anesthesiologist   Preanesthetic Checklist Completed: patient identified, site marked, surgical consent, pre-op evaluation, timeout performed, IV checked, risks and benefits discussed and monitors and equipment checked  Epidural Patient position: sitting Prep: site prepped and draped and DuraPrep Patient monitoring: continuous pulse ox and blood pressure Approach: midline Injection technique: LOR air  Needle:  Needle type: Tuohy  Needle gauge: 17 G Needle length: 9 cm Needle insertion depth: 6 cm Catheter type: closed end flexible Catheter size: 19 Gauge Catheter at skin depth: 11 cm Test dose: negative and 1.5% lidocaine  Assessment Sensory level: T8 Events: blood not aspirated, injection not painful, no injection resistance, negative IV test and no paresthesia

## 2011-11-10 NOTE — Progress Notes (Signed)
Pt in for labor eval, reports ucs q4-5 minutes since 0115.  Denies any bleeding or leaking of fluid.  + FM.

## 2011-11-10 NOTE — Anesthesia Preprocedure Evaluation (Signed)
Anesthesia Evaluation  Patient identified by MRN, date of birth, ID band Patient awake    Reviewed: Allergy & Precautions, H&P , Patient's Chart, lab work & pertinent test results  Airway Mallampati: II TM Distance: >3 FB Neck ROM: full    Dental No notable dental hx.    Pulmonary neg pulmonary ROS,    Pulmonary exam normal       Cardiovascular neg cardio ROS     Neuro/Psych Negative Neurological ROS  Negative Psych ROS   GI/Hepatic negative GI ROS, Neg liver ROS,   Endo/Other  Morbid obesity  Renal/GU negative Renal ROS  Genitourinary negative   Musculoskeletal negative musculoskeletal ROS (+)   Abdominal (+) obese,   Peds negative pediatric ROS (+)  Hematology negative hematology ROS (+)   Anesthesia Other Findings   Reproductive/Obstetrics (+) Pregnancy                           Anesthesia Physical Anesthesia Plan  ASA: III  Anesthesia Plan: Epidural   Post-op Pain Management:    Induction:   Airway Management Planned:   Additional Equipment:   Intra-op Plan:   Post-operative Plan:   Informed Consent: I have reviewed the patients History and Physical, chart, labs and discussed the procedure including the risks, benefits and alternatives for the proposed anesthesia with the patient or authorized representative who has indicated his/her understanding and acceptance.     Plan Discussed with:   Anesthesia Plan Comments:         Anesthesia Quick Evaluation  

## 2011-11-10 NOTE — H&P (Signed)
This is Dr. Francoise Ceo dictating the history and physical on  Ashley Ware she's a 31 year old gravida 5 para 2113 13 6 weeks and 2 days her EDC is 12/06/2011 patient came in in labor cervix 4-5 cm 80% and the vertex at -2-3 station GBS unknown she was treated with penicillin membranes ruptured artificially fluid clear Past medical history negative Past surgical history she had a hemorrhagic cyst and laparotomy some years ago Social history negative Family history negative Physical exam HEENT negative Lungs clear to P&A Heart regular rhythm no murmurs no gallops Breasts negative Abdomen 36 weeks as Pelvic as described above Extremities negative

## 2011-11-10 NOTE — Progress Notes (Signed)
Patient ID: Ashley Ware, female   DOB: [birth year], 31 y.o.   MRN: [id number] Cervix is 5-6 cm 85% vertex -2-3 IUPC inserted and she was started low-dose Pitocin

## 2011-11-10 NOTE — Progress Notes (Signed)
Patient states she has been having contractions every 4 minutes. Denies any bleeding or leaking and reports good fetal movement.  

## 2011-11-11 ENCOUNTER — Encounter (HOSPITAL_COMMUNITY): Payer: Self-pay

## 2011-11-11 LAB — RUBELLA SCREEN: Rubella: 15.9 IU/mL — ABNORMAL HIGH

## 2011-11-11 LAB — HEPATITIS B SURFACE ANTIGEN: Hepatitis B Surface Ag: NEGATIVE

## 2011-11-11 MED ORDER — OXYCODONE-ACETAMINOPHEN 5-325 MG PO TABS
1.0000 | ORAL_TABLET | ORAL | Status: DC | PRN
  Filled 2011-11-11: qty 1

## 2011-11-11 MED ORDER — BENZOCAINE-MENTHOL 20-0.5 % EX AERO
INHALATION_SPRAY | CUTANEOUS | Status: AC
  Filled 2011-11-11: qty 56

## 2011-11-11 MED ORDER — ZOLPIDEM TARTRATE 5 MG PO TABS: 5.0000 mg | ORAL_TABLET | Freq: Every evening | ORAL | Status: DC | PRN

## 2011-11-11 MED ORDER — PRENATAL MULTIVITAMIN CH
1.0000 | ORAL_TABLET | Freq: Every day | ORAL | Status: DC
  Administered 2011-11-11 – 2011-11-13 (×3): 1 via ORAL
  Filled 2011-11-11 (×3): qty 1

## 2011-11-11 MED ORDER — SENNOSIDES-DOCUSATE SODIUM 8.6-50 MG PO TABS
2.0000 | ORAL_TABLET | Freq: Every day | ORAL | Status: DC
  Administered 2011-11-11 – 2011-11-12 (×2): 2 via ORAL

## 2011-11-11 MED ORDER — ONDANSETRON HCL 4 MG/2ML IJ SOLN: 4.0000 mg | INTRAMUSCULAR | Status: DC | PRN

## 2011-11-11 MED ORDER — IBUPROFEN 600 MG PO TABS
600.0000 mg | ORAL_TABLET | Freq: Four times a day (QID) | ORAL | Status: DC
  Administered 2011-11-11 – 2011-11-13 (×8): 600 mg via ORAL
  Filled 2011-11-11 (×11): qty 1

## 2011-11-11 MED ORDER — FERROUS SULFATE 325 (65 FE) MG PO TABS
325.0000 mg | ORAL_TABLET | Freq: Two times a day (BID) | ORAL | Status: DC
  Administered 2011-11-11 – 2011-11-13 (×5): 325 mg via ORAL
  Filled 2011-11-11 (×5): qty 1

## 2011-11-11 MED ORDER — TETANUS-DIPHTH-ACELL PERTUSSIS 5-2.5-18.5 LF-MCG/0.5 IM SUSP
0.5000 mL | Freq: Once | INTRAMUSCULAR | Status: AC
  Administered 2011-11-12: 0.5 mL via INTRAMUSCULAR
  Filled 2011-11-11: qty 0.5

## 2011-11-11 MED ORDER — LANOLIN HYDROUS EX OINT: TOPICAL_OINTMENT | CUTANEOUS | Status: DC | PRN

## 2011-11-11 MED ORDER — DIPHENHYDRAMINE HCL 25 MG PO CAPS: 25.0000 mg | ORAL_CAPSULE | Freq: Four times a day (QID) | ORAL | Status: DC | PRN

## 2011-11-11 MED ORDER — BENZOCAINE-MENTHOL 20-0.5 % EX AERO
1.0000 "application " | INHALATION_SPRAY | CUTANEOUS | Status: DC | PRN
  Administered 2011-11-11: 1 via TOPICAL

## 2011-11-11 MED ORDER — WITCH HAZEL-GLYCERIN EX PADS: 1.0000 "application " | MEDICATED_PAD | CUTANEOUS | Status: DC | PRN

## 2011-11-11 MED ORDER — SIMETHICONE 80 MG PO CHEW: 80.0000 mg | CHEWABLE_TABLET | ORAL | Status: DC | PRN

## 2011-11-11 MED ORDER — DIBUCAINE 1 % RE OINT: 1.0000 "application " | TOPICAL_OINTMENT | RECTAL | Status: DC | PRN

## 2011-11-11 MED ORDER — ONDANSETRON HCL 4 MG PO TABS: 4.0000 mg | ORAL_TABLET | ORAL | Status: DC | PRN

## 2011-11-11 NOTE — Anesthesia Postprocedure Evaluation (Signed)
  Anesthesia Post-op Note  Patient: Ashley Ware  Procedure(s) Performed: * No procedures listed *  Patient Location: Mother/Baby  Anesthesia Type: Epidural  Level of Consciousness: alert  and oriented  Airway and Oxygen Therapy: Patient Spontanous Breathing  Post-op Pain: mild  Post-op Assessment: Patient's Cardiovascular Status Stable and Respiratory Function Stable  Post-op Vital Signs: stable  Complications: No apparent anesthesia complications

## 2011-11-11 NOTE — Progress Notes (Signed)
Dr. Gaynell Face phoned to check patient status.  Informed of recent SVE, UC's, MVUs, and interventions.  No new orders received at this time.

## 2011-11-11 NOTE — Anesthesia Postprocedure Evaluation (Signed)
Anesthesia Post Note  Patient: Ashley Ware  Procedure(s) Performed: * No procedures listed *  Anesthesia type: Epidural  Patient location: Mother/Baby  Post pain: Pain level controlled  Post assessment: Post-op Vital signs reviewed  Last Vitals:  Filed Vitals:   11/11/11 0646  BP: 128/58  Pulse: 91  Temp:   Resp: 18    Post vital signs: Reviewed  Level of consciousness: awake  Complications: No apparent anesthesia complications

## 2011-11-11 NOTE — Addendum Note (Signed)
Addendum  created 11/11/11 1222 by Fanny Dance, CRNA   Modules edited:Charges VN, Notes Section

## 2011-11-11 NOTE — Progress Notes (Signed)
Dr. Gaynell Face called to check on patient UCs.  Advised of MVU's and current pitocin rate.  No new orders received.

## 2011-11-11 NOTE — Progress Notes (Signed)
Pt. To room 144 and report given to [facility].  Pt. With no complaints at this time.  Family at side.

## 2011-11-12 LAB — CBC
Platelets: 190 10*3/uL (ref 150–400)
RBC: 3.94 MIL/uL (ref 3.87–5.11)
RDW: 12.7 % (ref 11.5–15.5)
WBC: 8.9 10*3/uL (ref 4.0–10.5)

## 2011-11-12 NOTE — Progress Notes (Signed)
Patient ID: Ashley Ware, female   DOB: 1981-04-14, 31 y.o.   MRN: [id number] Postpartum day one Vital signs normal Fundus firm Legs negative Him  Next let complaints and and

## 2011-11-13 ENCOUNTER — Ambulatory Visit (HOSPITAL_COMMUNITY): Payer: BC Managed Care – PPO

## 2011-11-13 ENCOUNTER — Encounter (HOSPITAL_COMMUNITY)
Admit: 2011-11-13 | Discharge: 2011-11-13 | Disposition: A | Discharge status: Home / Self Care | Payer: BC Managed Care – PPO | Attending: Obstetrics | Admitting: Obstetrics

## 2011-11-13 DIAGNOSIS — O923 Agalactia: Secondary | ICD-10-CM | POA: Insufficient documentation

## 2011-11-13 MED ORDER — BENZOCAINE-MENTHOL 20-0.5 % EX AERO
INHALATION_SPRAY | CUTANEOUS | Status: AC
  Filled 2011-11-13: qty 56

## 2011-11-13 NOTE — Discharge Summary (Signed)
Obstetric Discharge Summary Reason for Admission: onset of labor Prenatal Procedures: none Intrapartum Procedures: spontaneous vaginal delivery Postpartum Procedures: none Complications-Operative and Postpartum: none Hemoglobin  Date Value Range Status  11/12/2011 11.3* 12.0-15.0 (g/dL) Final     HCT  Date Value Range Status  11/12/2011 35.4* 36.0-46.0 (%) Final    Discharge Diagnoses: Term Pregnancy-delivered  Discharge Information: Date: 11/13/2011 Activity: pelvic rest Diet: routine Medications: Percocet Condition: stable Instructions: refer to practice specific booklet Discharge to: home Follow-up Information    Follow up with [name], MD. Call in 6 weeks.   Contact information:   [address] [place] [address] [phone]          Newborn Data: Live born female  Birth Weight: 6 lb 15 oz (3147 g) APGAR: 8, 9  Home with mother.  [name] 11/13/2011, 6:33 AM

## 2011-11-13 NOTE — Progress Notes (Signed)
SW received message from FOB stating that he wasn't here when SW met with MOB and asked if there was anything he could do to "help."  SW attempted to return his call, but he did not answer and there was no option to leave a message.  SW to try again at a later time. 

## 2011-11-13 NOTE — Progress Notes (Signed)
SW saw FOB coming out of MOB's room and stopped to talk with him.  He was incredibly polite and friendly and asked SW to come in.  He states he was not here yesterday because he was dealing with getting baby added to his insurance.  SW informed him that this was not a problem at all and although SW would prefer to meet with both parents initially, it is often hard to catch NICU parents at the same time.  He was very understanding.  Both parents were appreciative of gas card and state no other needs or questions at this time.  SW asked them to please let SW know if there is anything SW can do to support and assist them during baby's NICU stay. 

## 2011-11-13 NOTE — Progress Notes (Signed)
I first met [name] and her family while rounding in the NICU.  They are concerned about the baby's health, and hopeful that they may be able to take him home on Sunday.  The time in the NICU was unexpected for them and it is bringing up memories of losing their daughter after 26 weeks in utero.  They have called baby [name] their "miracle baby."  I offered emotional support, prayer and pastoral presence.  [employer] Pager, [phone] 3:24 PM   11/13/11 1500  Clinical Encounter Type  Visited With Patient and family together  Visit Type Spiritual support  Spiritual Encounters  Spiritual Needs Prayer;Emotional  Stress Factors  Patient Stress Factors Other (Comment) (Concern about baby's health)

## 2011-11-13 NOTE — Progress Notes (Signed)
PSYCHOSOCIAL ASSESSMENT ~ MATERNAL/CHILD Name: Ashley Ware                                                                                                       Age: 31   Referral Date: 11/12/11   Reason/Source: NICU Support/NICU  I. FAMILY/HOME ENVIRONMENT A. Child's Legal Guardian _x__Parent(s) ___Grandparent ___Foster parent ___DSS_________________ Name: Ashley Ware                                         DOB: Jun 20, 1981          Age: 54  Address: [address] [place] [address]  Name: Ashley Ware                                 DOB: //                     Age:   Address: same  B. Other Household Members/Support Persons Name:                                         Relationship: sister (62)                   Name:                                         Relationship: sister (9)                   Name:                                         Relationship: sister (7)   C. Other Support: MOB states good support system.  Paternal aunt in room with her today.   II. PSYCHOSOCIAL DATA A. Information Source                                                                                             _x_Patient Interview  _x_Family Interview           _x_Other: chart  B. Event organiser _x_Employment: MOB works at [employer], FOB works at [employer] __Medicaid    [place]:  _x_Private Insurance: BCBS                 __Self Pay  __Food Stamps   __WIC __[employer] First     __Public Housing     __Section 8    __Maternity Care Coordination/Child Service Coordination/Early Intervention  __School:                                                                         Grade:  __Other:   Salena Saner Cultural and Environment Information Cultural Issues Impacting Care: none known  III. STRENGTHS _x__Supportive family/friends _x__Adequate Resources _x__Compliance with medical plan _x__Home prepared for Child (including basic supplies) _x__Understanding of  illness      ___Other: IV. RISK FACTORS AND CURRENT PROBLEMS         __x__No Problems Noted                                                                                                                                                                                                                                       Pt              Family     Substance Abuse                                                                ___              ___        Mental Illness                                                                        ___  ___  Family/Relationship Issues                                      ___               ___             Abuse/Neglect/Domestic Violence                                         ___         ___  Financial Resources                                        ___              ___             Transportation                                                                        ___               ___  DSS Involvement                                                                   ___              ___  Adjustment to Illness                                                               ___              ___  Knowledge/Cognitive Deficit                                                   ___              ___             Compliance with Treatment                                                 ___              ___  Basic Needs (food, housing, etc.)  ___              ___             Housing Concerns                                       ___              ___ Other_____________________________________________________________            V. SOCIAL WORK ASSESSMENT SW met with MOB in her first floor room to introduce myself, complete assessment and evaluate how family is coping with baby's admission to NICU.  MOB was resting, but asked SW to come in.  FOB's sister was visiting with her and she said we could talk with her present.  MOB  states she and baby are doing well.  She seems to have a very good understanding of the situation and appears to be coping well.  She reports having a good support system and most everything she needs for baby at home.  She states they have the means to get the rest of the baby supplies before baby is discharged.  She expects her day of discharge will be emotional since he will have to stay in NICU.  SW validated her feelings and asked if she will have any issues with transportation.  She states that she lives more than 30 minutes away and will not have issues, but it will not be easy with the price of gas and other children at home.  SW offered a gas card and she was appreciative.  She states that her children's grandmother is caring for them and will continue to help.  MOB states no questions or needs at this time.  SW explained support services offered by NICU SWs and gave contact information.    VI. SOCIAL WORK PLAN  ___No Further Intervention Required/No Barriers to Discharge   _x__Psychosocial Support and Ongoing Assessment of Needs   ___Patient/Family Education:   ___Child Protective Services Report   County___________ Date___/____/____   ___Information/Referral to [employer]_________________________   ___Other:

## 2011-12-13 ENCOUNTER — Encounter (HOSPITAL_COMMUNITY)
Admission: RE | Admit: 2011-12-13 | Discharge: 2011-12-13 | Disposition: A | Discharge status: Home / Self Care | Payer: BC Managed Care – PPO | Source: Ambulatory Visit | Attending: Obstetrics | Admitting: Obstetrics

## 2011-12-13 DIAGNOSIS — O923 Agalactia: Secondary | ICD-10-CM | POA: Insufficient documentation

## 2011-12-23 ENCOUNTER — Other Ambulatory Visit: Payer: Self-pay | Admitting: Obstetrics

## 2012-01-11 ENCOUNTER — Encounter (HOSPITAL_COMMUNITY): Payer: Self-pay | Admitting: Pharmacist

## 2012-01-13 ENCOUNTER — Encounter (HOSPITAL_COMMUNITY)
Admission: RE | Admit: 2012-01-13 | Discharge: 2012-01-13 | Disposition: A | Discharge status: Home / Self Care | Payer: BC Managed Care – PPO | Source: Ambulatory Visit | Attending: Obstetrics | Admitting: Obstetrics

## 2012-01-13 DIAGNOSIS — O923 Agalactia: Secondary | ICD-10-CM | POA: Insufficient documentation

## 2012-01-20 ENCOUNTER — Encounter (HOSPITAL_COMMUNITY): Admission: RE | Payer: Self-pay | Source: Ambulatory Visit

## 2012-01-20 ENCOUNTER — Ambulatory Visit (HOSPITAL_COMMUNITY): Admission: RE | Admit: 2012-01-20 | Payer: BC Managed Care – PPO | Source: Ambulatory Visit | Admitting: Obstetrics

## 2012-01-20 SURGERY — LIGATION, FALLOPIAN TUBE, LAPAROSCOPIC
Anesthesia: General | Laterality: Bilateral

## 2012-02-13 ENCOUNTER — Encounter (HOSPITAL_COMMUNITY)
Admission: RE | Admit: 2012-02-13 | Discharge: 2012-02-13 | Disposition: A | Discharge status: Home / Self Care | Payer: BC Managed Care – PPO | Source: Ambulatory Visit | Attending: Obstetrics | Admitting: Obstetrics

## 2012-02-13 DIAGNOSIS — O923 Agalactia: Secondary | ICD-10-CM | POA: Insufficient documentation

## 2012-03-15 ENCOUNTER — Encounter (HOSPITAL_COMMUNITY)
Admission: RE | Admit: 2012-03-15 | Discharge: 2012-03-15 | Disposition: A | Discharge status: Home / Self Care | Payer: BC Managed Care – PPO | Source: Ambulatory Visit | Attending: Obstetrics | Admitting: Obstetrics

## 2012-03-15 DIAGNOSIS — O923 Agalactia: Secondary | ICD-10-CM | POA: Insufficient documentation

## 2012-04-15 ENCOUNTER — Encounter (HOSPITAL_COMMUNITY)
Admission: RE | Admit: 2012-04-15 | Discharge: 2012-04-15 | Disposition: A | Discharge status: Home / Self Care | Payer: BC Managed Care – PPO | Source: Ambulatory Visit | Attending: Obstetrics | Admitting: Obstetrics

## 2012-04-15 DIAGNOSIS — O923 Agalactia: Secondary | ICD-10-CM | POA: Insufficient documentation

## 2012-05-16 ENCOUNTER — Encounter (HOSPITAL_COMMUNITY)
Admission: RE | Admit: 2012-05-16 | Discharge: 2012-05-16 | Disposition: A | Discharge status: Home / Self Care | Payer: BC Managed Care – PPO | Source: Ambulatory Visit | Attending: Obstetrics | Admitting: Obstetrics

## 2012-05-16 DIAGNOSIS — O923 Agalactia: Secondary | ICD-10-CM | POA: Insufficient documentation

## 2012-06-16 ENCOUNTER — Encounter (HOSPITAL_COMMUNITY)
Admission: RE | Admit: 2012-06-16 | Discharge: 2012-06-16 | Disposition: A | Discharge status: Home / Self Care | Payer: BC Managed Care – PPO | Source: Ambulatory Visit | Attending: Obstetrics | Admitting: Obstetrics

## 2012-06-16 DIAGNOSIS — O923 Agalactia: Secondary | ICD-10-CM | POA: Insufficient documentation

## 2013-12-13 ENCOUNTER — Encounter (HOSPITAL_COMMUNITY): Payer: Self-pay

## 2013-12-13 ENCOUNTER — Inpatient Hospital Stay (HOSPITAL_COMMUNITY): Payer: BC Managed Care – PPO

## 2013-12-13 ENCOUNTER — Inpatient Hospital Stay (HOSPITAL_COMMUNITY)
Admission: AD | Admit: 2013-12-13 | Discharge: 2013-12-13 | Disposition: A | Discharge status: Home / Self Care | Payer: BC Managed Care – PPO | Source: Ambulatory Visit | Attending: Obstetrics & Gynecology | Admitting: Obstetrics & Gynecology

## 2013-12-13 DIAGNOSIS — O4690 Antepartum hemorrhage, unspecified, unspecified trimester: Secondary | ICD-10-CM

## 2013-12-13 DIAGNOSIS — O99891 Other specified diseases and conditions complicating pregnancy: Secondary | ICD-10-CM | POA: Insufficient documentation

## 2013-12-13 DIAGNOSIS — R197 Diarrhea, unspecified: Secondary | ICD-10-CM | POA: Insufficient documentation

## 2013-12-13 DIAGNOSIS — R109 Unspecified abdominal pain: Secondary | ICD-10-CM | POA: Insufficient documentation

## 2013-12-13 DIAGNOSIS — O9989 Other specified diseases and conditions complicating pregnancy, childbirth and the puerperium: Principal | ICD-10-CM

## 2013-12-13 DIAGNOSIS — O209 Hemorrhage in early pregnancy, unspecified: Secondary | ICD-10-CM

## 2013-12-13 LAB — URINALYSIS, ROUTINE W REFLEX MICROSCOPIC
Bilirubin Urine: NEGATIVE
GLUCOSE, UA: NEGATIVE mg/dL
KETONES UR: NEGATIVE mg/dL
Leukocytes, UA: NEGATIVE
Nitrite: NEGATIVE
PROTEIN: NEGATIVE mg/dL
Specific Gravity, Urine: >1.03 — ABNORMAL HIGH (ref 1.005–1.030)
Urobilinogen, UA: 0.2 mg/dL (ref 0.0–1.0)
pH: 6 (ref 5.0–8.0)

## 2013-12-13 LAB — CBC
HEMATOCRIT: 37.9 % (ref 36.0–46.0)
Hemoglobin: 13.3 g/dL (ref 12.0–15.0)
MCH: 31.1 pg (ref 26.0–34.0)
MCHC: 35.1 g/dL (ref 30.0–36.0)
MCV: 88.6 fL (ref 78.0–100.0)
Platelets: 222 10*3/uL (ref 150–400)
RBC: 4.28 MIL/uL (ref 3.87–5.11)
RDW: 12.5 % (ref 11.5–15.5)
WBC: 9.3 10*3/uL (ref 4.0–10.5)

## 2013-12-13 LAB — URINE MICROSCOPIC-ADD ON

## 2013-12-13 LAB — WET PREP, GENITAL
TRICH WET PREP: NONE SEEN
YEAST WET PREP: NONE SEEN

## 2013-12-13 LAB — POCT PREGNANCY, URINE: PREG TEST UR: POSITIVE — AB

## 2013-12-13 LAB — HCG, QUANTITATIVE, PREGNANCY: HCG, BETA CHAIN, QUANT, S: 81458 m[IU]/mL — AB (ref <5)

## 2013-12-13 MED ORDER — METRONIDAZOLE 500 MG PO TABS: 500.0000 mg | ORAL_TABLET | Freq: Two times a day (BID) | ORAL | Status: AC

## 2013-12-13 NOTE — Discharge Instructions (Signed)
Pregnancy - First Trimester  During sexual intercourse, millions of sperm go into the vagina. Only 1 sperm will penetrate and fertilize the female egg while it is in the Fallopian tube. One week later, the fertilized egg implants into the wall of the uterus. An embryo begins to develop into a baby. At 6 to 8 weeks, the eyes and face are formed and the heartbeat can be seen on ultrasound. At the end of 12 weeks (first trimester), all the baby's organs are formed. Now that you are pregnant, you will want to do everything you can to have a healthy baby. Two of the most important things are to get good prenatal care and follow your caregiver's instructions. Prenatal care is all the medical care you receive before the baby's birth. It is given to prevent, find, and treat problems during the pregnancy and childbirth.  PRENATAL EXAMS  · During prenatal visits, your weight, blood pressure, and urine are checked. This is done to make sure you are healthy and progressing normally during the pregnancy.  · A pregnant woman should gain 25 to 35 pounds during the pregnancy. However, if you are overweight or underweight, your caregiver will advise you regarding your weight.  · Your caregiver will ask and answer questions for you.  · Blood work, cervical cultures, other necessary tests, and a Pap test are done during your prenatal exams. These tests are done to check on your health and the probable health of your baby. Tests are strongly recommended and done for HIV with your permission. This is the virus that causes AIDS. These tests are done because medicines can be given to help prevent your baby from being born with this infection should you have been infected without knowing it. Blood work is also used to find out your blood type, previous infections, and follow your blood levels (hemoglobin).  · Low hemoglobin (anemia) is common during pregnancy. Iron and vitamins are given to help prevent this. Later in the pregnancy, blood  tests for diabetes will be done along with any other tests if any problems develop.  · You may need other tests to make sure you and the baby are doing well.  CHANGES DURING THE FIRST TRIMESTER   Your body goes through many changes during pregnancy. They vary from person to person. Talk to your caregiver about changes you notice and are concerned about. Changes can include:  · Your menstrual period stops.  · The egg and sperm carry the genes that determine what you look like. Genes from you and your partner are forming a baby. The female genes determine whether the baby is a boy or a girl.  · Your body increases in girth and you may feel bloated.  · Feeling sick to your stomach (nauseous) and throwing up (vomiting). If the vomiting is uncontrollable, call your caregiver.  · Your breasts will begin to enlarge and become tender.  · Your nipples may stick out more and become darker.  · The need to urinate more. Painful urination may mean you have a bladder infection.  · Tiring easily.  · Loss of appetite.  · Cravings for certain kinds of food.  · At first, you may gain or lose a couple of pounds.  · You may have changes in your emotions from day to day (excited to be pregnant or concerned something may go wrong with the pregnancy and baby).  · You may have more vivid and strange dreams.  HOME CARE INSTRUCTIONS   ·   It is very important to avoid all smoking, alcohol and non-prescribed drugs during your pregnancy. These affect the formation and growth of the baby. Avoid chemicals while pregnant to ensure the delivery of a healthy infant.  · Start your prenatal visits by the 12th week of pregnancy. They are usually scheduled monthly at first, then more often in the last 2 months before delivery. Keep your caregiver's appointments. Follow your caregiver's instructions regarding medicine use, blood and lab tests, exercise, and diet.  · During pregnancy, you are providing food for you and your baby. Eat regular, well-balanced  meals. Choose foods such as meat, fish, milk and other low fat dairy products, vegetables, fruits, and whole-grain breads and cereals. Your caregiver will tell you of the ideal weight gain.  · You can help morning sickness by keeping soda crackers at the bedside. Eat a couple before arising in the morning. You may want to use the crackers without salt on them.  · Eating 4 to 5 small meals rather than 3 large meals a day also may help the nausea and vomiting.  · Drinking liquids between meals instead of during meals also seems to help nausea and vomiting.  · A physical sexual relationship may be continued throughout pregnancy if there are no other problems. Problems may be early (premature) leaking of amniotic fluid from the membranes, vaginal bleeding, or belly (abdominal) pain.  · Exercise regularly if there are no restrictions. Check with your caregiver or physical therapist if you are unsure of the safety of some of your exercises. Greater weight gain will occur in the last 2 trimesters of pregnancy. Exercising will help:  · Control your weight.  · Keep you in shape.  · Prepare you for labor and delivery.  · Help you lose your pregnancy weight after you deliver your baby.  · Wear a good support or jogging bra for breast tenderness during pregnancy. This may help if worn during sleep too.  · Ask when prenatal classes are available. Begin classes when they are offered.  · Do not use hot tubs, steam rooms, or saunas.  · Wear your seat belt when driving. This protects you and your baby if you are in an accident.  · Avoid raw meat, uncooked cheese, cat litter boxes, and soil used by cats throughout the pregnancy. These carry germs that can cause birth defects in the baby.  · The first trimester is a good time to visit your dentist for your dental health. Getting your teeth cleaned is okay. Use a softer toothbrush and brush gently during pregnancy.  · Ask for help if you have financial, counseling, or nutritional needs  during pregnancy. Your caregiver will be able to offer counseling for these needs as well as refer you for other special needs.  · Do not take any medicines or herbs unless told by your caregiver.  · Inform your caregiver if there is any mental or physical domestic violence.  · Make a list of emergency phone numbers of family, friends, hospital, and police and fire departments.  · Write down your questions. Take them to your prenatal visit.  · Do not douche.  · Do not cross your legs.  · If you have to stand for long periods of time, rotate you feet or take small steps in a circle.  · You may have more vaginal secretions that may require a sanitary pad. Do not use tampons or scented sanitary pads.  MEDICINES AND DRUG USE IN PREGNANCY  ·   Take prenatal vitamins as directed. The vitamin should contain 1 milligram of folic acid. Keep all vitamins out of reach of children. Only a couple vitamins or tablets containing iron may be fatal to a baby or young child when ingested.  · Avoid use of all medicines, including herbs, over-the-counter medicines, not prescribed or suggested by your caregiver. Only take over-the-counter or prescription medicines for pain, discomfort, or fever as directed by your caregiver. Do not use aspirin, ibuprofen, or naproxen unless directed by your caregiver.  · Let your caregiver also know about herbs you may be using.  · Alcohol is related to a number of birth defects. This includes fetal alcohol syndrome. All alcohol, in any form, should be avoided completely. Smoking will cause low birth rate and premature babies.  · Street or illegal drugs are very harmful to the baby. They are absolutely forbidden. A baby born to an addicted mother will be addicted at birth. The baby will go through the same withdrawal an adult does.  · Let your caregiver know about any medicines that you have to take and for what reason you take them.  SEEK MEDICAL CARE IF:   You have any concerns or worries during your  pregnancy. It is better to call with your questions if you feel they cannot wait, rather than worry about them.  SEEK IMMEDIATE MEDICAL CARE IF:   · An unexplained oral temperature above 102° F (38.9° C) develops, or as your caregiver suggests.  · You have leaking of fluid from the vagina (birth canal). If leaking membranes are suspected, take your temperature and inform your caregiver of this when you call.  · There is vaginal spotting or bleeding. Notify your caregiver of the amount and how many pads are used.  · You develop a bad smelling vaginal discharge with a change in the color.  · You continue to feel sick to your stomach (nauseated) and have no relief from remedies suggested. You vomit blood or coffee ground-like materials.  · You lose more than 2 pounds of weight in 1 week.  · You gain more than 2 pounds of weight in 1 week and you notice swelling of your face, hands, feet, or legs.  · You gain 5 pounds or more in 1 week (even if you do not have swelling of your hands, face, legs, or feet).  · You get exposed to German measles and have never had them.  · You are exposed to fifth disease or chickenpox.  · You develop belly (abdominal) pain. Round ligament discomfort is a common non-cancerous (benign) cause of abdominal pain in pregnancy. Your caregiver still must evaluate this.  · You develop headache, fever, diarrhea, pain with urination, or shortness of breath.  · You fall or are in a car accident or have any kind of trauma.  · There is mental or physical violence in your home.  Document Released: 09/15/2001 Document Revised: 06/15/2012 Document Reviewed: 03/19/2009  [employer]® Patient Information ©2014 [employer].

## 2013-12-13 NOTE — MAU Provider Note (Signed)
History     CSN: [id number]  Arrival date and time: 12/13/13 1039   First Provider Initiated Contact with Patient 12/13/13 1119      Chief Complaint  Patient presents with  . Possible Pregnancy  . Diarrhea  . Abdominal Pain  . Vaginal Bleeding   HPI This is a 33 y.o. female at [redacted]w[redacted]d by LMP who reports c/o diarrhea for several days  Denies fever or vomiting. Had red spotting this morning.    RN Note: Patient states she has been having diarrhea with off and on abdominal pain for about 4 days. No vomiting. States she has had 2 positive home pregnancy tests. Had bright red spotting this am and once since. No active bleeding.        OB History   Grav Para Term Preterm Abortions TAB SAB Ect Mult Living   6 4 2 2 1  0 1 0 0 4      Past Medical History  Diagnosis Date  . No pertinent past medical history   . Medical history non-contributory     Past Surgical History  Procedure Laterality Date  . Breast lumpectomy    . Cesarean section      C/S x 1    Family History  Problem Relation Age of Onset  . Adopted: Yes  . Anesthesia problems Neg Hx   . Hypotension Neg Hx   . Malignant hyperthermia Neg Hx   . Pseudochol deficiency Neg Hx     History  Substance Use Topics  . Smoking status: Former Smoker    Quit date: 12/13/2013  . Smokeless tobacco: Never Used  . Alcohol Use: No    Allergies: No Known Allergies  No prescriptions prior to admission    Review of Systems  Constitutional: Negative for fever, chills and malaise/fatigue.  Gastrointestinal: Positive for nausea and diarrhea. Negative for vomiting, abdominal pain and constipation.  Genitourinary: Negative for dysuria.  Neurological: Negative for dizziness and weakness.   Physical Exam   Blood pressure 123/77, pulse 99, temperature 99.3 F (37.4 C), temperature source Oral, resp. rate 16, height 5\' 4"  (1.626 m), weight 98.975 kg (218 lb 3.2 oz), last menstrual period 10/13/2013, SpO2 99.00%, unknown if  currently breastfeeding.  Physical Exam  Constitutional: She is oriented to person, place, and time. She appears well-developed and well-nourished. No distress.  HENT:  Head: Normocephalic.  Cardiovascular: Normal rate.   Respiratory: Effort normal.  GI: Soft. She exhibits no distension. There is no tenderness. There is no rebound and no guarding.  Genitourinary: Vagina normal and uterus normal. No vaginal discharge (No blood seen) found.  Musculoskeletal: Normal range of motion.  Neurological: She is alert and oriented to person, place, and time.  Skin: Skin is warm and dry.  Psychiatric: She has a normal mood and affect.    MAU Course  Procedures  MDM Results for XOLANI, DEGRACIA (MRN [id number]) as of 12/16/2013 22:52  Ref. Range 12/13/2013 11:38  hCG, Beta Chain, Quant, S Latest Range: <5 mIU/mL 81458 (H)   Results for MARCHELLE, RINELLA (MRN [id number]) as of 12/16/2013 22:52  Ref. Range 12/13/2013 11:38  WBC Latest Range: 4.0-10.5 K/uL 9.3  RBC Latest Range: 3.87-5.11 MIL/uL 4.28  Hemoglobin Latest Range: 12.0-15.0 g/dL 62.9  HCT Latest Range: 36.0-46.0 % 37.9  MCV Latest Range: 78.0-100.0 fL 88.6  MCH Latest Range: 26.0-34.0 pg 31.1  MCHC Latest Range: 30.0-36.0 g/dL 52.8  RDW Latest Range: 11.5-15.5 % 12.5  Platelets Latest Range: 150-400 K/uL 222  Results for Victoriano LainHARRIS, Guillermo L (MRN [id number]) as of 12/16/2013 22:52  Ref. Range 12/13/2013 10:55  Color, Urine Latest Range: YELLOW  YELLOW  APPearance Latest Range: CLEAR  CLEAR  Specific Gravity, Urine Latest Range: 1.005-1.030  >1.030 (H)  pH Latest Range: 5.0-8.0  6.0  Glucose Latest Range: NEGATIVE mg/dL NEGATIVE  Bilirubin Urine Latest Range: NEGATIVE  NEGATIVE  Ketones, ur Latest Range: NEGATIVE mg/dL NEGATIVE  Protein Latest Range: NEGATIVE mg/dL NEGATIVE  Urobilinogen, UA Latest Range: 0.0-1.0 mg/dL 0.2  Nitrite Latest Range: NEGATIVE  NEGATIVE  Leukocytes, UA Latest Range: NEGATIVE  NEGATIVE  Hgb urine dipstick Latest  Range: NEGATIVE  LARGE (A)  RBC / HPF Latest Range: <3 RBC/hpf 3-6  Squamous Epithelial / LPF Latest Range: RARE  RARE  Bacteria, UA Latest Range: RARE  RARE   [place] Ob Comp Less 14 Wks  12/13/2013   CLINICAL DATA Vaginal bleeding  EXAM OBSTETRIC <14 WK ULTRASOUND  TECHNIQUE Transabdominal ultrasound was performed for evaluation of the gestation as well as the maternal uterus and adnexal regions.  COMPARISON None.  FINDINGS Intrauterine gestational sac: Visualized/normal in shape.  Yolk sac:  Present.  Embryo:  Present.  Cardiac Activity: Present.  Heart Rate: 172 bpm  CRL:   18.9  mm   8 w 3d                  US EDC: 07/22/2014  Maternal uterus/adnexae: There is no adnexal mass. Bilateral ovaries are normal in size. No pelvic free fluid.    IMPRESSION Single live intrauterine pregnancy dating 8 weeks 3 days with an ultrasound EDC of 07/22/2014.    SIGNATURE  Electronically Signed   By: Elige KoHetal  Patel   On: 12/13/2013 13:41   Assessment and Plan  A:  SIUP at 8.3 weeks      Diarrheal illness      No sign of dehydration  P:  Discussed findings       May use immodium       If persists, will need stool sample       Hydration       Followup with prenatal care  [facility] CampusWILLIAMS,MARIE 12/13/2013, 1:01 PM

## 2013-12-13 NOTE — MAU Note (Signed)
Patient states she has been having diarrhea with off and on abdominal pain for about 4 days. No vomiting. States she has had 2 positive home pregnancy tests. Had bright red spotting this am and once since. No active bleeding.

## 2013-12-14 LAB — GC/CHLAMYDIA PROBE AMP
CT Probe RNA: NEGATIVE
GC Probe RNA: NEGATIVE

## 2014-01-17 ENCOUNTER — Emergency Department (HOSPITAL_COMMUNITY): Payer: BC Managed Care – PPO

## 2014-01-17 ENCOUNTER — Emergency Department (HOSPITAL_COMMUNITY)
Admission: EM | Admit: 2014-01-17 | Discharge: 2014-01-17 | Disposition: A | Discharge status: Home / Self Care | Payer: BC Managed Care – PPO | Attending: Emergency Medicine | Admitting: Emergency Medicine

## 2014-01-17 ENCOUNTER — Emergency Department (HOSPITAL_COMMUNITY)
Admission: EM | Admit: 2014-01-17 | Discharge: 2014-01-17 | Disposition: A | Discharge status: Home / Self Care | Payer: BC Managed Care – PPO | Source: Home / Self Care | Attending: Emergency Medicine | Admitting: Emergency Medicine

## 2014-01-17 ENCOUNTER — Encounter (HOSPITAL_COMMUNITY): Payer: Self-pay | Admitting: Emergency Medicine

## 2014-01-17 DIAGNOSIS — N83209 Unspecified ovarian cyst, unspecified side: Secondary | ICD-10-CM

## 2014-01-17 DIAGNOSIS — R109 Unspecified abdominal pain: Secondary | ICD-10-CM

## 2014-01-17 DIAGNOSIS — F17200 Nicotine dependence, unspecified, uncomplicated: Secondary | ICD-10-CM | POA: Insufficient documentation

## 2014-01-17 LAB — POCT URINALYSIS DIP (DEVICE)
Bilirubin Urine: NEGATIVE
Glucose, UA: NEGATIVE mg/dL
KETONES UR: NEGATIVE mg/dL
Nitrite: NEGATIVE
PROTEIN: NEGATIVE mg/dL
Specific Gravity, Urine: 1.015 (ref 1.005–1.030)
Urobilinogen, UA: 0.2 mg/dL (ref 0.0–1.0)
pH: 6 (ref 5.0–8.0)

## 2014-01-17 LAB — COMPREHENSIVE METABOLIC PANEL
ALT: 6 U/L (ref 0–35)
AST: 11 U/L (ref 0–37)
Albumin: 3.9 g/dL (ref 3.5–5.2)
Alkaline Phosphatase: 47 U/L (ref 39–117)
BILIRUBIN TOTAL: 0.5 mg/dL (ref 0.3–1.2)
BUN: 10 mg/dL (ref 6–23)
CHLORIDE: 105 meq/L (ref 96–112)
CO2: 23 meq/L (ref 19–32)
CREATININE: 0.6 mg/dL (ref 0.50–1.10)
Calcium: 9.4 mg/dL (ref 8.4–10.5)
GFR calc Af Amer: >90 mL/min (ref >90)
Glucose, Bld: 88 mg/dL (ref 70–99)
Potassium: 4 mEq/L (ref 3.7–5.3)
Sodium: 142 mEq/L (ref 137–147)
Total Protein: 7.3 g/dL (ref 6.0–8.3)

## 2014-01-17 LAB — CBC WITH DIFFERENTIAL/PLATELET
BASOS PCT: 0 % (ref 0–1)
Basophils Absolute: 0 10*3/uL (ref 0.0–0.1)
Eosinophils Absolute: 0.1 10*3/uL (ref 0.0–0.7)
Eosinophils Relative: 1 % (ref 0–5)
HCT: 40.3 % (ref 36.0–46.0)
Hemoglobin: 13.9 g/dL (ref 12.0–15.0)
LYMPHS PCT: 21 % (ref 12–46)
Lymphs Abs: 2.4 10*3/uL (ref 0.7–4.0)
MCH: 31.3 pg (ref 26.0–34.0)
MCHC: 34.5 g/dL (ref 30.0–36.0)
MCV: 90.8 fL (ref 78.0–100.0)
MONO ABS: 0.6 10*3/uL (ref 0.1–1.0)
Monocytes Relative: 6 % (ref 3–12)
NEUTROS ABS: 8.3 10*3/uL — AB (ref 1.7–7.7)
Neutrophils Relative %: 72 % (ref 43–77)
Platelets: 234 10*3/uL (ref 150–400)
RBC: 4.44 MIL/uL (ref 3.87–5.11)
RDW: 12.3 % (ref 11.5–15.5)
WBC: 11.4 10*3/uL — ABNORMAL HIGH (ref 4.0–10.5)

## 2014-01-17 LAB — POCT PREGNANCY, URINE: PREG TEST UR: NEGATIVE

## 2014-01-17 LAB — LIPASE, BLOOD: LIPASE: 13 U/L (ref 11–59)

## 2014-01-17 MED ORDER — KETOROLAC TROMETHAMINE 30 MG/ML IJ SOLN
30.0000 mg | Freq: Once | INTRAMUSCULAR | Status: AC
  Administered 2014-01-17: 30 mg via INTRAVENOUS
  Filled 2014-01-17: qty 1

## 2014-01-17 MED ORDER — ONDANSETRON HCL 4 MG/2ML IJ SOLN
4.0000 mg | Freq: Once | INTRAMUSCULAR | Status: AC
  Administered 2014-01-17: 4 mg via INTRAVENOUS
  Filled 2014-01-17: qty 2

## 2014-01-17 MED ORDER — MORPHINE SULFATE 4 MG/ML IJ SOLN
4.0000 mg | Freq: Once | INTRAMUSCULAR | Status: AC
  Administered 2014-01-17: 4 mg via INTRAVENOUS
  Filled 2014-01-17: qty 1

## 2014-01-17 MED ORDER — HYDROCODONE-ACETAMINOPHEN 5-325 MG PO TABS: 2.0000 | ORAL_TABLET | ORAL | Status: DC | PRN

## 2014-01-17 MED ORDER — IOHEXOL 300 MG/ML  SOLN: 80.0000 mL | Freq: Once | INTRAMUSCULAR | Status: DC | PRN

## 2014-01-17 NOTE — ED Notes (Signed)
LMP mid March, sexually active, condom use. C/o severe lower abdominal pain , pain lower back and pain in SHOULDERS. Feels like it did when she had a ruptured ovarian cyst

## 2014-01-17 NOTE — ED Notes (Signed)
Dr. Delo at the bedside 

## 2014-01-17 NOTE — ED Provider Notes (Signed)
CSN: [id number]     Arrival date & time 01/17/14  1036 History   First MD Initiated Contact with Patient 01/17/14 1050     Chief Complaint  Patient presents with  . Back Pain  . Abdominal Pain     (Consider location/radiation/quality/duration/timing/severity/associated sxs/prior Treatment) HPI Comments: Patient is a 33 year old female sent from urgent care for further evaluation of abdominal and right flank pain. She states her discomfort started abruptly yesterday evening and has worsened throughout the night. She reports this pain as a stabbing and it is located in her right flank and radiates to her right abdomen. She denies any vaginal bleeding, dysuria, bowel complaints. She's never had discomfort like this before. She denies any history of kidney stones. She denies the possibility of pregnancy and does report that she had a miscarriage approximately one month ago.  Patient is a 33 y.o. female presenting with abdominal pain. The history is provided by the patient.  Abdominal Pain Pain location:  RUQ, RLQ and R flank Pain quality: stabbing   Pain radiates to:  Does not radiate Pain severity:  Severe Onset quality:  Sudden Duration:  12 hours Timing:  Constant Progression:  Worsening Chronicity:  New Relieved by:  Nothing Worsened by:  Movement and palpation Ineffective treatments:  None tried   Past Medical History  Diagnosis Date  . No pertinent past medical history   . Medical history non-contributory    Past Surgical History  Procedure Laterality Date  . Breast lumpectomy    . Cesarean section      C/S x 1   Family History  Problem Relation Age of Onset  . Adopted: Yes  . Anesthesia problems Neg Hx   . Hypotension Neg Hx   . Malignant hyperthermia Neg Hx   . Pseudochol deficiency Neg Hx    History  Substance Use Topics  . Smoking status: Current Every Day Smoker    Last Attempt to Quit: 12/13/2013  . Smokeless tobacco: Never Used  . Alcohol Use: No   OB  History   Grav Para Term Preterm Abortions TAB SAB Ect Mult Living   6 4 2 2 1  0 1 0 0 4     Review of Systems  Gastrointestinal: Positive for abdominal pain.  All other systems reviewed and are negative.     Allergies  Review of patient's allergies indicates no known allergies.  Home Medications   Prior to Admission medications   Not on File   BP 117/65  Pulse 91  Temp(Src) 97.7 F (36.5 C) (Oral)  Resp 18  Ht 5\' 3"  (1.6 m)  Wt 218 lb (98.884 kg)  BMI 38.63 kg/m2  SpO2 98%  LMP 12/17/2013 Physical Exam  Nursing note and vitals reviewed. Constitutional: She is oriented to person, place, and time. She appears well-developed and well-nourished. No distress.  HENT:  Head: Normocephalic and atraumatic.  Neck: Normal range of motion. Neck supple.  Cardiovascular: Normal rate and regular rhythm.  Exam reveals no gallop and no friction rub.   No murmur heard. Pulmonary/Chest: Effort normal and breath sounds normal. No respiratory distress. She has no wheezes.  Abdominal: Soft. Bowel sounds are normal. She exhibits no distension. There is tenderness.  There is tenderness to palpation in the right mid abdomen and right flank. There is no rebound and no guarding.  Musculoskeletal: Normal range of motion.  Neurological: She is alert and oriented to person, place, and time.  Skin: Skin is warm and dry. She is not  diaphoretic.    ED Course  Procedures (including critical care time) Labs Review Labs Reviewed  CBC WITH DIFFERENTIAL  COMPREHENSIVE METABOLIC PANEL  LIPASE, BLOOD    Imaging Review No results found.   EKG Interpretation None      MDM   Final diagnoses:  None    Patient is a 33 year old female presents with right-sided abdominal pain that started last night. Pain is sharp in nature. Her symptoms sounded like a kidney stone, however workup including CBC, CT scan reveal a hemorrhagic cyst on the right ovary. Her pregnancy test and UA at the urgent care  were unremarkable. She is feeling better with medications given in the ER I believe is appropriate for discharge with pain medication and followup with GYN.    Geoffery Lyonsouglas Saydie Gerdts, MD 01/17/14 1414

## 2014-01-17 NOTE — ED Notes (Signed)
Patient taken to CT.

## 2014-01-17 NOTE — ED Provider Notes (Signed)
CSN: [id number]     Arrival date & time 01/17/14  [id number] History   First MD Initiated Contact with Patient 01/17/14 [phone]     Chief Complaint  Patient presents with  . Abdominal Pain   (Consider location/radiation/quality/duration/timing/severity/associated sxs/prior Treatment) HPI Comments: C-sect x 1 in past LNMP: mid March 2015 OB/GYN: Dr. Clearance CootsHarper PCP: none Reports pain increases with walking and "it really hurt when I would hit a bump while driving over to the urgent care." Has sense of tenesmus, but only passing small amounts of diarrhea stool.  Patient is a smoker.   Patient is a 33 y.o. female presenting with abdominal pain. The history is provided by the patient.  Abdominal Pain Pain location:  Periumbilical, RLQ and RUQ Pain quality: aching and sharp   Pain radiates to:  L shoulder and back Pain severity:  Moderate Onset quality:  Gradual Duration: began feeling unwell yesterday. Timing:  Constant Progression since onset: gradually worsening. Chronicity:  New Context: awakening from sleep   Associated symptoms: anorexia and diarrhea   Associated symptoms: no chest pain, no chills, no constipation, no cough, no dysuria, no fatigue, no fever, no flatus, no hematemesis, no hematochezia, no hematuria, no melena, no nausea, no shortness of breath, no sore throat, no vaginal bleeding, no vaginal discharge and no vomiting     Past Medical History  Diagnosis Date  . No pertinent past medical history   . Medical history non-contributory    Past Surgical History  Procedure Laterality Date  . Breast lumpectomy    . Cesarean section      C/S x 1   Family History  Problem Relation Age of Onset  . Adopted: Yes  . Anesthesia problems Neg Hx   . Hypotension Neg Hx   . Malignant hyperthermia Neg Hx   . Pseudochol deficiency Neg Hx    History  Substance Use Topics  . Smoking status: Former Smoker    Quit date: 12/13/2013  . Smokeless tobacco: Never Used  . Alcohol Use: No    OB History   Grav Para Term Preterm Abortions TAB SAB Ect Mult Living   6 4 2 2 1  0 1 0 0 4     Review of Systems  Constitutional: Negative for fever, chills and fatigue.  HENT: Negative for sore throat.   Respiratory: Negative for cough and shortness of breath.   Cardiovascular: Negative for chest pain.  Gastrointestinal: Positive for abdominal pain, diarrhea and anorexia. Negative for nausea, vomiting, constipation, melena, hematochezia, flatus and hematemesis.  Genitourinary: Negative for dysuria, hematuria, vaginal bleeding and vaginal discharge.  All other systems reviewed and are negative.   Allergies  Review of patient's allergies indicates no known allergies.  Home Medications   Prior to Admission medications   Not on File   BP 115/76  Pulse 103  Temp(Src) 98.3 F (36.8 C) (Oral)  Resp 16  SpO2 97%  LMP 12/17/2013  Breastfeeding? Unknown Physical Exam  Nursing note and vitals reviewed. Constitutional: She is oriented to person, place, and time. She appears well-developed and well-nourished.  +appears uncomfortable  HENT:  Head: Normocephalic and atraumatic.  Eyes: Conjunctivae are normal. No scleral icterus.  Neck: Normal range of motion. Neck supple.  Cardiovascular: Normal rate, regular rhythm and normal heart sounds.   Pulmonary/Chest: Effort normal and breath sounds normal.  Abdominal: Soft. Normal appearance and bowel sounds are normal. She exhibits no mass. There is no hepatosplenomegaly. There is tenderness in the right upper quadrant, right lower quadrant  and periumbilical area. There is tenderness at McBurney's point.  +psoas sign  Musculoskeletal: Normal range of motion.  Neurological: She is alert and oriented to person, place, and time.  Skin: Skin is warm and dry.  Psychiatric: She has a normal mood and affect. Her behavior is normal.    ED Course  Procedures (including critical care time) Labs Review Labs Reviewed  POCT URINALYSIS DIP  (DEVICE) - Abnormal; Notable for the following:    Hgb urine dipstick SMALL (*)    Leukocytes, UA SMALL (*)    All other components within normal limits  POCT PREGNANCY, URINE    Results for orders placed during the hospital encounter of 01/17/14  POCT URINALYSIS DIP (DEVICE)      Result Value Ref Range   Glucose, UA NEGATIVE  NEGATIVE mg/dL   Bilirubin Urine NEGATIVE  NEGATIVE   Ketones, ur NEGATIVE  NEGATIVE mg/dL   Specific Gravity, Urine 1.015  1.005 - 1.030   Hgb urine dipstick SMALL (*) NEGATIVE   pH 6.0  5.0 - 8.0   Protein, ur NEGATIVE  NEGATIVE mg/dL   Urobilinogen, UA 0.2  0.0 - 1.0 mg/dL   Nitrite NEGATIVE  NEGATIVE   Leukocytes, UA SMALL (*) NEGATIVE  POCT PREGNANCY, URINE      Result Value Ref Range   Preg Test, Ur NEGATIVE  NEGATIVE   Imaging Review No results found.   MDM   1. Abdominal pain   Exam most suggestive of acute appendicitis. However patient also with some mild RUQ discomfort on exam, therefore, acute cholecystitis included in differential diagnosis.With respect to RLQ discomfort ruptured ovarian cyst or ovarian torsion can also be considered. Advised patient that necessary imaging to provide diagnostic clarification [facility] UPT: negative UA: unremarkable [facility]    Jess BartersJennifer Lee [place], [place] 01/17/14 1031  Addendum: chart reopened to clarify LNMP.   Jess BartersJennifer Lee [place], [place] 01/17/14 1034

## 2014-01-17 NOTE — ED Notes (Signed)
Pt reports that she started having lower back pain last night that radiated around to the front of her abd this morning. Pt denies any n/v or urinary symptoms. Denies any constipation or diarrhea.

## 2014-01-17 NOTE — ED Notes (Signed)
MD Delo at bedside. 

## 2014-01-17 NOTE — Discharge Instructions (Signed)
Hydrocodone as prescribed as needed for pain.  Followup with your GYN in the next several days for a recheck, and return to the emergency department if you develop worsening pain, high fever, bleeding, or other new and concerning symptoms.   Ovarian Cyst An ovarian cyst is a fluid-filled sac that forms on an ovary. The ovaries are small organs that produce eggs in women. Various types of cysts can form on the ovaries. Most are not cancerous. Many do not cause problems, and they often go away on their own. Some may cause symptoms and require treatment. Common types of ovarian cysts include:  Functional cysts These cysts may occur every month during the menstrual cycle. This is normal. The cysts usually go away with the next menstrual cycle if the woman does not get pregnant. Usually, there are no symptoms with a functional cyst.  Endometrioma cysts These cysts form from the tissue that lines the uterus. They are also called "chocolate cysts" because they become filled with blood that turns brown. This type of cyst can cause pain in the lower abdomen during intercourse and with your menstrual period.  Cystadenoma cysts This type develops from the cells on the outside of the ovary. These cysts can get very big and cause lower abdomen pain and pain with intercourse. This type of cyst can twist on itself, cut off its blood supply, and cause severe pain. It can also easily rupture and cause a lot of pain.  Dermoid cysts This type of cyst is sometimes found in both ovaries. These cysts may contain different kinds of body tissue, such as skin, teeth, hair, or cartilage. They usually do not cause symptoms unless they get very big.  Theca lutein cysts These cysts occur when too much of a certain hormone (human chorionic gonadotropin) is produced and overstimulates the ovaries to produce an egg. This is most common after procedures used to assist with the conception of a baby (in vitro fertilization). CAUSES     Fertility drugs can cause a condition in which multiple large cysts are formed on the ovaries. This is called ovarian hyperstimulation syndrome.  A condition called polycystic ovary syndrome can cause hormonal imbalances that can lead to nonfunctional ovarian cysts. SIGNS AND SYMPTOMS  Many ovarian cysts do not cause symptoms. If symptoms are present, they may include:  Pelvic pain or pressure.  Pain in the lower abdomen.  Pain during sexual intercourse.  Increasing girth (swelling) of the abdomen.  Abnormal menstrual periods.  Increasing pain with menstrual periods.  Stopping having menstrual periods without being pregnant. DIAGNOSIS  These cysts are commonly found during a routine or annual pelvic exam. Tests may be ordered to find out more about the cyst. These tests may include:  Ultrasound.  X-ray of the pelvis.  CT scan.  MRI.  Blood tests. TREATMENT  Many ovarian cysts go away on their own without treatment. Your health care provider may want to check your cyst regularly for 2 3 months to see if it changes. For women in menopause, it is particularly important to monitor a cyst closely because of the higher rate of ovarian cancer in menopausal women. When treatment is needed, it may include any of the following:  A procedure to drain the cyst (aspiration). This may be done using a long needle and ultrasound. It can also be done through a laparoscopic procedure. This involves using a thin, lighted tube with a tiny camera on the end (laparoscope) inserted through a small incision.  Surgery  to remove the whole cyst. This may be done using laparoscopic surgery or an open surgery involving a larger incision in the lower abdomen.  Hormone treatment or birth control pills. These methods are sometimes used to help dissolve a cyst. HOME CARE INSTRUCTIONS   Only take over-the-counter or prescription medicines as directed by your health care provider.  Follow up with your  health care provider as directed.  Get regular pelvic exams and Pap tests. SEEK MEDICAL CARE IF:   Your periods are late, irregular, or painful, or they stop.  Your pelvic pain or abdominal pain does not go away.  Your abdomen becomes larger or swollen.  You have pressure on your bladder or trouble emptying your bladder completely.  You have pain during sexual intercourse.  You have feelings of fullness, pressure, or discomfort in your stomach.  You lose weight for no apparent reason.  You feel generally ill.  You become constipated.  You lose your appetite.  You develop acne.  You have an increase in body and facial hair.  You are gaining weight, without changing your exercise and eating habits.  You think you are pregnant. SEEK IMMEDIATE MEDICAL CARE IF:   You have increasing abdominal pain.  You feel sick to your stomach (nauseous), and you throw up (vomit).  You develop a fever that comes on suddenly.  You have abdominal pain during a bowel movement.  Your menstrual periods become heavier than usual. Document Released: 09/21/2005 Document Revised: 07/12/2013 Document Reviewed: 05/29/2013 [facility] Patient Information 2014 [place], [place].

## 2014-01-18 NOTE — ED Provider Notes (Signed)
Medical screening examination/treatment/procedure(s) were performed by non-physician practitioner and as supervising physician I was immediately available for consultation/collaboration.  Leslee Homeavid Betzaida Cremeens, M.D.  Reuben Likesavid C Tiannah Greenly, MD 01/18/14 [phone]

## 2014-03-27 ENCOUNTER — Encounter (HOSPITAL_COMMUNITY): Payer: Self-pay

## 2014-05-06 ENCOUNTER — Inpatient Hospital Stay (HOSPITAL_COMMUNITY)
Admission: AD | Admit: 2014-05-06 | Discharge: 2014-05-07 | Disposition: A | Discharge status: Home / Self Care | Payer: BC Managed Care – PPO | Source: Ambulatory Visit | Attending: Obstetrics and Gynecology | Admitting: Obstetrics and Gynecology

## 2014-05-06 ENCOUNTER — Encounter (HOSPITAL_COMMUNITY): Payer: Self-pay

## 2014-05-06 DIAGNOSIS — O468X1 Other antepartum hemorrhage, first trimester: Secondary | ICD-10-CM

## 2014-05-06 DIAGNOSIS — O99330 Smoking (tobacco) complicating pregnancy, unspecified trimester: Secondary | ICD-10-CM | POA: Insufficient documentation

## 2014-05-06 DIAGNOSIS — O418X10 Other specified disorders of amniotic fluid and membranes, first trimester, not applicable or unspecified: Secondary | ICD-10-CM

## 2014-05-06 DIAGNOSIS — O208 Other hemorrhage in early pregnancy: Secondary | ICD-10-CM | POA: Insufficient documentation

## 2014-05-06 DIAGNOSIS — O209 Hemorrhage in early pregnancy, unspecified: Secondary | ICD-10-CM

## 2014-05-06 LAB — CBC
HCT: 38.2 % (ref 36.0–46.0)
HEMOGLOBIN: 13.6 g/dL (ref 12.0–15.0)
MCH: 32.5 pg (ref 26.0–34.0)
MCHC: 35.6 g/dL (ref 30.0–36.0)
MCV: 91.4 fL (ref 78.0–100.0)
PLATELETS: 222 10*3/uL (ref 150–400)
RBC: 4.18 MIL/uL (ref 3.87–5.11)
RDW: 12.9 % (ref 11.5–15.5)
WBC: 10.4 10*3/uL (ref 4.0–10.5)

## 2014-05-06 LAB — URINALYSIS, ROUTINE W REFLEX MICROSCOPIC
Bilirubin Urine: NEGATIVE
Glucose, UA: NEGATIVE mg/dL
KETONES UR: NEGATIVE mg/dL
LEUKOCYTES UA: NEGATIVE
NITRITE: NEGATIVE
PROTEIN: NEGATIVE mg/dL
Specific Gravity, Urine: 1.02 (ref 1.005–1.030)
Urobilinogen, UA: 0.2 mg/dL (ref 0.0–1.0)
pH: 6 (ref 5.0–8.0)

## 2014-05-06 LAB — WET PREP, GENITAL
Clue Cells Wet Prep HPF POC: NONE SEEN
Trich, Wet Prep: NONE SEEN
Yeast Wet Prep HPF POC: NONE SEEN

## 2014-05-06 LAB — URINE MICROSCOPIC-ADD ON

## 2014-05-06 LAB — HCG, QUANTITATIVE, PREGNANCY: HCG, BETA CHAIN, QUANT, S: 30159 m[IU]/mL — AB (ref <5)

## 2014-05-06 LAB — POCT PREGNANCY, URINE: Preg Test, Ur: POSITIVE — AB

## 2014-05-06 NOTE — MAU Provider Note (Signed)
Chief Complaint: Possible Pregnancy and Vaginal Bleeding   First Provider Initiated Contact with Patient 05/06/14 2247     SUBJECTIVE HPI: Ashley Ware is a 33 y.o. [id number] at [redacted]w[redacted]d by LMP who presents to maternity admissions reporting positive home pregnancy test 2 weeks ago and onset of bright red bleeding when wiping a few hours ago. She has obstetric hx significant for 3 SABs, 2 first trimester, 1 second trimester, ~19 weeks.  She denies vaginal itching/burning, urinary symptoms, h/a, dizziness, n/v, or fever/chills.     Past Medical History  Diagnosis Date  . No pertinent past medical history   . Medical history non-contributory    Past Surgical History  Procedure Laterality Date  . Breast lumpectomy    . Cesarean section      C/S x 1   History   Social History  . Marital Status: Single    Spouse Name: N/A    Number of Children: N/A  . Years of Education: N/A   Occupational History  . Not on file.   Social History Main Topics  . Smoking status: Current Every Day Smoker -- 0.50 packs/day    Types: Cigarettes  . Smokeless tobacco: Never Used  . Alcohol Use: No  . Drug Use: No  . Sexual Activity: Yes    Birth Control/ Protection: None   Other Topics Concern  . Not on file   Social History Narrative  . No narrative on file   No current facility-administered medications on file prior to encounter.   Current Outpatient Prescriptions on File Prior to Encounter  Medication Sig Dispense Refill  . Cyanocobalamin (VITAMIN B-12 PO) Take 1 tablet by mouth daily.      Chilton Si Tea, Camillia sinensis, (GREEN TEA PO) Take 1 tablet by mouth daily.       Marland Kitchen HYDROcodone-acetaminophen (NORCO) 5-325 MG per tablet Take 2 tablets by mouth every 4 (four) hours as needed.  20 tablet  0   No Known Allergies  ROS: Pertinent items in HPI  OBJECTIVE Pulse 92, temperature 99 F (37.2 C), resp. rate 18, height 5\' 4"  (1.626 m), weight 99.066 kg (218 lb 6.4 oz), last menstrual period  03/24/2014, SpO2 100.00%, not currently breastfeeding. GENERAL: Well-developed, well-nourished female in no acute distress.  HEENT: Normocephalic HEART: normal rate RESP: normal effort ABDOMEN: Soft, non-tender EXTREMITIES: Nontender, no edema NEURO: Alert and oriented Pelvic exam: Cervix pink, visually closed, without lesion, small amount dark red bleeding with clots, vaginal walls and external genitalia normal Bimanual exam: Cervix 0/long/high, firm, anterior, neg CMT, uterus nontender, nonenlarged, adnexa without tenderness, enlargement, or mass  LAB RESULTS Results for orders placed during the hospital encounter of 05/06/14 (from the past 24 hour(s))  URINALYSIS, ROUTINE W REFLEX MICROSCOPIC     Status: Abnormal   Collection Time    05/06/14 10:26 PM      Result Value Ref Range   Color, Urine YELLOW  YELLOW   APPearance CLEAR  CLEAR   Specific Gravity, Urine 1.020  1.005 - 1.030   pH 6.0  5.0 - 8.0   Glucose, UA NEGATIVE  NEGATIVE mg/dL   Hgb urine dipstick LARGE (*) NEGATIVE   Bilirubin Urine NEGATIVE  NEGATIVE   Ketones, ur NEGATIVE  NEGATIVE mg/dL   Protein, ur NEGATIVE  NEGATIVE mg/dL   Urobilinogen, UA 0.2  0.0 - 1.0 mg/dL   Nitrite NEGATIVE  NEGATIVE   Leukocytes, UA NEGATIVE  NEGATIVE  URINE MICROSCOPIC-ADD ON     Status: None  Collection Time    05/06/14 10:26 PM      Result Value Ref Range   Squamous Epithelial / LPF RARE  RARE   WBC, UA 0-2  <3 WBC/hpf   RBC / HPF 3-6  <3 RBC/hpf   Bacteria, UA RARE  RARE  POCT PREGNANCY, URINE     Status: Abnormal   Collection Time    05/06/14 10:32 PM      Result Value Ref Range   Preg Test, Ur POSITIVE (*) NEGATIVE  CBC     Status: None   Collection Time    05/06/14 10:45 PM      Result Value Ref Range   WBC 10.4  4.0 - 10.5 K/uL   RBC 4.18  3.87 - 5.11 MIL/uL   Hemoglobin 13.6  12.0 - 15.0 g/dL   HCT 16.138.2  09.636.0 - 04.546.0 %   MCV 91.4  78.0 - 100.0 fL   MCH 32.5  26.0 - 34.0 pg   MCHC 35.6  30.0 - 36.0 g/dL   RDW  40.912.9  81.111.5 - 91.415.5 %   Platelets 222  150 - 400 K/uL  HCG, QUANTITATIVE, PREGNANCY     Status: Abnormal   Collection Time    05/06/14 10:45 PM      Result Value Ref Range   hCG, Beta Chain, Quant, S 30159 (*) <5 mIU/mL  WET PREP, GENITAL     Status: Abnormal   Collection Time    05/06/14 11:00 PM      Result Value Ref Range   Yeast Wet Prep HPF POC NONE SEEN  NONE SEEN   Trich, Wet Prep NONE SEEN  NONE SEEN   Clue Cells Wet Prep HPF POC NONE SEEN  NONE SEEN   WBC, Wet Prep HPF POC MODERATE (*) NONE SEEN    IMAGING Preliminary report indicates IUP with dates matching LMP and small SCH  ASSESSMENT 1. Vaginal bleeding in pregnancy, first trimester   2. Subchorionic hemorrhage in first trimester     PLAN Discharge home with bleeding precautions [facility] answered pt questions Pelvic rest while actively bleeding  Follow-up Information   [facility] (As scheduled)    Contact information:   [address] [place] [id number] [phone]      [facility] with [facility]. (As needed for emergencies)    Contact information:   [address] [id number] [place] [phone]      Sharen CounterLisa Leftwich-Kirby Certified Nurse-Midwife 05/07/2014  2:12 AM

## 2014-05-06 NOTE — MAU Note (Signed)
Positive HPT 2 weeks ago. LMP 6/20. Vaginal bleeding tonight; red & in toilet. RLQ cramping after bleeding episode.

## 2014-05-07 ENCOUNTER — Inpatient Hospital Stay (HOSPITAL_COMMUNITY): Payer: BC Managed Care – PPO

## 2014-05-07 DIAGNOSIS — O4690 Antepartum hemorrhage, unspecified, unspecified trimester: Secondary | ICD-10-CM

## 2014-05-07 NOTE — Discharge Instructions (Signed)
Vaginal Bleeding During Pregnancy, First Trimester  A small amount of bleeding (spotting) from the vagina is relatively common in early pregnancy. It usually stops on its own. Various things may cause bleeding or spotting in early pregnancy. Some bleeding may be related to the pregnancy, and some may not. In most cases, the bleeding is normal and is not a problem. However, bleeding can also be a sign of something serious. Be sure to tell your health care provider about any vaginal bleeding right away.  Some possible causes of vaginal bleeding during the first trimester include:  · Infection or inflammation of the cervix.  · Growths (polyps) on the cervix.  · Miscarriage or threatened miscarriage.  · Pregnancy tissue has developed outside of the uterus and in a fallopian tube (tubal pregnancy).  · Tiny cysts have developed in the uterus instead of pregnancy tissue (molar pregnancy).  HOME CARE INSTRUCTIONS   Watch your condition for any changes. The following actions may help to lessen any discomfort you are feeling:  · Follow your health care provider's instructions for limiting your activity. If your health care provider orders bed rest, you may need to stay in bed and only get up to use the bathroom. However, your health care provider may allow you to continue light activity.  · If needed, make plans for someone to help with your regular activities and responsibilities while you are on bed rest.  · Keep track of the number of pads you use each day, how often you change pads, and how soaked (saturated) they are. Write this down.  · Do not use tampons. Do not douche.  · Do not have sexual intercourse or orgasms until approved by your health care provider.  · If you pass any tissue from your vagina, save the tissue so you can show it to your health care provider.  · Only take over-the-counter or prescription medicines as directed by your health care provider.  · Do not take aspirin because it can make you  bleed.  · Keep all follow-up appointments as directed by your health care provider.  SEEK MEDICAL CARE IF:  · You have any vaginal bleeding during any part of your pregnancy.  · You have cramps or labor pains.  · You have a fever, not controlled by medicine.  SEEK IMMEDIATE MEDICAL CARE IF:   · You have severe cramps in your back or belly (abdomen).  · You pass large clots or tissue from your vagina.  · Your bleeding increases.  · You feel light-headed or weak, or you have fainting episodes.  · You have chills.  · You are leaking fluid or have a gush of fluid from your vagina.  · You pass out while having a bowel movement.  MAKE SURE YOU:  · Understand these instructions.  · Will watch your condition.  · Will get help right away if you are not doing well or get worse.  Document Released: 07/01/2005 Document Revised: 09/26/2013 Document Reviewed: 05/29/2013  ExitCare® Patient Information ©2015 [employer]. This information is not intended to replace advice given to you by your health care provider. Make sure you discuss any questions you have with your health care provider.

## 2014-05-07 NOTE — MAU Provider Note (Signed)
Attestation of Attending Supervision of Advanced Practitioner (CNM/NP): Evaluation and management procedures were performed by the Advanced Practitioner under my supervision and collaboration.  I have reviewed the Advanced Practitioner's note and chart, and I agree with the management and plan.  [place] 05/07/2014 7:19 AM

## 2014-05-08 LAB — GC/CHLAMYDIA PROBE AMP
CT Probe RNA: NEGATIVE
GC Probe RNA: NEGATIVE

## 2014-06-09 ENCOUNTER — Inpatient Hospital Stay (HOSPITAL_COMMUNITY): Payer: BC Managed Care – PPO

## 2014-06-09 ENCOUNTER — Encounter (HOSPITAL_COMMUNITY): Payer: Self-pay | Admitting: *Deleted

## 2014-06-09 ENCOUNTER — Inpatient Hospital Stay (HOSPITAL_COMMUNITY)
Admission: AD | Admit: 2014-06-09 | Discharge: 2014-06-09 | Disposition: A | Discharge status: Home / Self Care | Payer: BC Managed Care – PPO | Source: Ambulatory Visit | Attending: Obstetrics & Gynecology | Admitting: Obstetrics & Gynecology

## 2014-06-09 DIAGNOSIS — O039 Complete or unspecified spontaneous abortion without complication: Secondary | ICD-10-CM | POA: Diagnosis not present

## 2014-06-09 DIAGNOSIS — O99330 Smoking (tobacco) complicating pregnancy, unspecified trimester: Secondary | ICD-10-CM | POA: Diagnosis not present

## 2014-06-09 DIAGNOSIS — O209 Hemorrhage in early pregnancy, unspecified: Secondary | ICD-10-CM | POA: Diagnosis present

## 2014-06-09 LAB — CBC
HEMATOCRIT: 35.4 % — AB (ref 36.0–46.0)
HEMOGLOBIN: 12.3 g/dL (ref 12.0–15.0)
MCH: 31.7 pg (ref 26.0–34.0)
MCHC: 34.7 g/dL (ref 30.0–36.0)
MCV: 91.2 fL (ref 78.0–100.0)
Platelets: 280 10*3/uL (ref 150–400)
RBC: 3.88 MIL/uL (ref 3.87–5.11)
RDW: 12.3 % (ref 11.5–15.5)
WBC: 10.6 10*3/uL — ABNORMAL HIGH (ref 4.0–10.5)

## 2014-06-09 LAB — SAMPLE TO BLOOD BANK

## 2014-06-09 LAB — HCG, QUANTITATIVE, PREGNANCY: hCG, Beta Chain, Quant, S: 1497 m[IU]/mL — ABNORMAL HIGH (ref <5)

## 2014-06-09 MED ORDER — METHYLERGONOVINE MALEATE 0.2 MG/ML IJ SOLN
0.2000 mg | Freq: Once | INTRAMUSCULAR | Status: AC
  Administered 2014-06-09: 0.2 mg via INTRAMUSCULAR
  Filled 2014-06-09: qty 1

## 2014-06-09 MED ORDER — METHYLERGONOVINE MALEATE 0.2 MG PO TABS: 0.2000 mg | ORAL_TABLET | Freq: Three times a day (TID) | ORAL | Status: DC

## 2014-06-09 MED ORDER — OXYCODONE-ACETAMINOPHEN 7.5-325 MG PO TABS: 1.0000 | ORAL_TABLET | ORAL | Status: DC | PRN

## 2014-06-09 MED ORDER — BUTORPHANOL TARTRATE 1 MG/ML IJ SOLN
INTRAMUSCULAR | Status: AC
  Filled 2014-06-09: qty 1

## 2014-06-09 MED ORDER — LACTATED RINGERS IV BOLUS (SEPSIS)
500.0000 mL | Freq: Once | INTRAVENOUS | Status: AC
  Administered 2014-06-09: 500 mL via INTRAVENOUS

## 2014-06-09 MED ORDER — LACTATED RINGERS IV SOLN
INTRAVENOUS | Status: DC
  Administered 2014-06-09: 18:00:00 via INTRAVENOUS

## 2014-06-09 MED ORDER — LACTATED RINGERS IV BOLUS (SEPSIS)
1000.0000 mL | Freq: Once | INTRAVENOUS | Status: AC
  Administered 2014-06-09: 1000 mL via INTRAVENOUS

## 2014-06-09 MED ORDER — BUTORPHANOL TARTRATE 1 MG/ML IJ SOLN
1.0000 mg | Freq: Once | INTRAMUSCULAR | Status: AC
  Administered 2014-06-09: 1 mg via INTRAVENOUS

## 2014-06-09 NOTE — MAU Note (Addendum)
Pt reports she started haivng spontaneous vaginal bleeding around 3pm. Pt had confirmed IUP in August (05/07/14). Pt stated she had some heavy bleeding 2 dayss later thought she miscarried did not follow up with her  OBGYN. No bleeding again until today. C/o of some pressure but not really having pain.

## 2014-06-09 NOTE — MAU Provider Note (Addendum)
Chief Complaint: Vaginal Bleeding  Provider notified: [phone]  Provider at bedside: 1705   SUBJECTIVE HPI: Ashley Ware is a 33 y.o. [id number] at [redacted]w[redacted]d by LMP who presents to maternity admissions by EMS with significant hemorrhage.  She was seen in MAU about 1 month ago after having a positive home pregnancy test and onset of bright red bleeding with wiping. She reports bleeding heavily a few days after MAU visit and passed a 4-5 cm round "thing".  Her bleeding then stopped, but was intermittently spotting on some days.  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  The bleeding was so much that she saturated through her clothes and a puddle of blood formed between her legs. She has obstetric hx significant for 3 SABs, 2 first trimester, 1 second trimester, ~19 weeks.  Sono 1 month ago revealed IUP with [facility]. She denies vaginal itching/burning, urinary symptoms, h/a, dizziness, n/v, or fever/chills.     Past Medical History  Diagnosis Date  . No pertinent past medical history   . Medical history non-contributory    Past Surgical History  Procedure Laterality Date  . Breast lumpectomy    . Cesarean section      C/S x 1   History   Social History  . Marital Status: Single    Spouse Name: N/A    Number of Children: N/A  . Years of Education: N/A   Occupational History  . Not on file.   Social History Main Topics  . Smoking status: Current Every Day Smoker -- 0.50 packs/day    Types: Cigarettes  . Smokeless tobacco: Never Used  . Alcohol Use: No  . Drug Use: No  . Sexual Activity: Yes    Birth Control/ Protection: None   Other Topics Concern  . Not on file   Social History Narrative  . No narrative on file   No current facility-administered medications on file prior to encounter.   No current outpatient prescriptions on file prior to encounter.   No Known Allergies  ROS: Pertinent items in  HPI  OBJECTIVE Blood pressure 112/75, pulse 93, resp. rate 18, last menstrual period 03/24/2014, SpO2 96.00%. GENERAL: Well-developed, well-nourished female in no acute distress.  HEENT: Normocephalic HEART: normal rate RESP: normal effort ABDOMEN: Soft, non-tender EXTREMITIES: Nontender, no edema NEURO: Alert and oriented Pelvic exam: Cervix pink, visually closed, without lesion, large amount bright, red bleeding with copious amounts of clots, vaginal walls and external genitalia normal Clots removed with Ring forceps without difficulty, with little pushing efforts patient expelled 1 kidney basin filled with blood and clots  LAB RESULTS Results for orders placed during the hospital encounter of 06/09/14 (from the past 24 hour(s))  CBC     Status: Abnormal   Collection Time    06/09/14  4:20 PM      Result Value Ref Range   WBC 10.6 (*) 4.0 - 10.5 K/uL   RBC 3.88  3.87 - 5.11 MIL/uL   Hemoglobin 12.3  12.0 - 15.0 g/dL   HCT 98.1 (*) 19.1 - 47.8 %   MCV 91.2  78.0 - 100.0 fL   MCH 31.7  26.0 - 34.0 pg   MCHC 34.7  30.0 - 36.0 g/dL   RDW 29.5  62.1 - 30.8 %   Platelets 280  150 - 400 K/uL  HCG, QUANTITATIVE, PREGNANCY     Status: Abnormal   Collection Time  06/09/14  4:20 PM      Result Value Ref Range   hCG, Beta Chain, Quant, S 1497 (*) <5 mIU/mL  Previous Beta HCG: 30159  US Ob Comp Less 14 Wks  06/09/2014   CLINICAL DATA:  Vaginal bleeding.  EXAM: OBSTETRIC <14 WK [place] AND TRANSVAGINAL OB US  TECHNIQUE: Both transabdominal and transvaginal ultrasound examinations were performed for complete evaluation of the gestation as well as the maternal uterus, adnexal regions, and pelvic cul-de-sac. Transvaginal technique was performed to assess early pregnancy.  COMPARISON:  05/07/2014  FINDINGS: Intrauterine gestational sac: No longer visualized.  Yolk sac:  No longer visualize.  Embryo:  No longer visualized.  Maternal uterus/adnexae: Endometrium was heterogeneous and thickened,  measuring 17 mm in thickness, with areas of internal vascularity and fluid/cystic change. There was evidence of active bleeding on examination, with movement identified in the fluid in the endometrium. A large hematoma was seen in the vagina. No free fluid was seen in the pelvis.  IMPRESSION: 1. Intrauterine gestation no longer seen, compatible with interval miscarriage. Evidence of active bleeding with large hematoma in the vagina. 2. Thickened, heterogeneous endometrium with some areas of internal vascularity and fluid/hemorrhage. Retained products conception are not excluded.   Electronically Signed   By: Sebastian Ache   On: 06/09/2014 17:33     ASSESSMENT/PLAN 32 yo [id number] @ [redacted] wks gestation Miscarriage Methergine 0.2mg  IM x 1  Stadol 1 mg IVP  Discharge home with bleeding precautions Methergine 0.2 mg po Pelvic rest while actively bleeding [facility] next week  *Dr. Seymour Bars notified of assessment / consulted on plan of care.  Agree with above. Patient evaluated, speculum exam done 40 min post Methergine 0.2 mg IM. More blood clots evacuated from the vagina.  No POC seen at [facility].  No active bleeding.  VE done, no POC in endocervical canal, no additional blood clot present. Probable complete Spontaneous Ab with resolved active bleeding, no IUP, deep drop in Quant BHCG and Hb at 12.3.  Rh pos. Decision to d/c home with bleeding precautions.  Will take Methergine 0.2 mg PO q8 hrs x 24 hrs.  Percocet PRN in alternance with Ibuprofen OTC.  FeSO4 supplement qd.  F/U W.Ob-Gyn in 1 wk with repeat US.  Raelyn Mora, M MSN, CNM  06/09/2014, 5:38 PM

## 2014-06-09 NOTE — Discharge Instructions (Signed)
Miscarriage A miscarriage is the sudden loss of an unborn baby (fetus) before the 20th week of pregnancy. Most miscarriages happen in the first 3 months of pregnancy. Sometimes, it happens before a woman even knows she is pregnant. A miscarriage is also called a "spontaneous miscarriage" or "early pregnancy loss." Having a miscarriage can be an emotional experience. Talk with your caregiver about any questions you may have about miscarrying, the grieving process, and your future pregnancy plans. CAUSES   Problems with the fetal chromosomes that make it impossible for the baby to develop normally. Problems with the baby's genes or chromosomes are most often the result of errors that occur, by chance, as the embryo divides and grows. The problems are not inherited from the parents.  Infection of the cervix or uterus.   Hormone problems.   Problems with the cervix, such as having an incompetent cervix. This is when the tissue in the cervix is not strong enough to hold the pregnancy.   Problems with the uterus, such as an abnormally shaped uterus, uterine fibroids, or congenital abnormalities.   Certain medical conditions.   Smoking, drinking alcohol, or taking illegal drugs.   Trauma.  Often, the cause of a miscarriage is unknown.  SYMPTOMS   Vaginal bleeding or spotting, with or without cramps or pain.  Pain or cramping in the abdomen or lower back.  Passing fluid, tissue, or blood clots from the vagina. DIAGNOSIS  Your caregiver will perform a physical exam. You may also have an ultrasound to confirm the miscarriage. Blood or urine tests may also be ordered. TREATMENT   Sometimes, treatment is not necessary if you naturally pass all the fetal tissue that was in the uterus. If some of the fetus or placenta remains in the body (incomplete miscarriage), tissue left behind may become infected and must be removed. Usually, a dilation and curettage (D and C) procedure is performed.  During a D and C procedure, the cervix is widened (dilated) and any remaining fetal or placental tissue is gently removed from the uterus.  Antibiotic medicines are prescribed if there is an infection. Other medicines may be given to reduce the size of the uterus (contract) if there is a lot of bleeding.  If you have Rh negative blood and your baby was Rh positive, you will need a Rh immunoglobulin shot. This shot will protect any future baby from having Rh blood problems in future pregnancies. HOME CARE INSTRUCTIONS   Your caregiver may order bed rest or may allow you to continue light activity. Resume activity as directed by your caregiver.  Have someone help with home and family responsibilities during this time.   Keep track of the number of sanitary pads you use each day and how soaked (saturated) they are. Write down this information.   Do not use tampons. Do not douche or have sexual intercourse until approved by your caregiver.   Only take over-the-counter or prescription medicines for pain or discomfort as directed by your caregiver.   Do not take aspirin. Aspirin can cause bleeding.   Keep all follow-up appointments with your caregiver.   If you or your partner have problems with grieving, talk to your caregiver or seek counseling to help cope with the pregnancy loss. Allow enough time to grieve before trying to get pregnant again.  SEEK IMMEDIATE MEDICAL CARE IF:   You have severe cramps or pain in your back or abdomen.  You have a fever.  You pass large blood clots (walnut-sized   or larger) ortissue from your vagina. Save any tissue for your caregiver to inspect.   Your bleeding increases.   You have a thick, bad-smelling vaginal discharge.  You become lightheaded, weak, or you faint.   You have chills.  MAKE SURE YOU:  Understand these instructions.  Will watch your condition.  Will get help right away if you are not doing well or get  worse. Document Released: 03/17/2001 Document Revised: 01/16/2013 Document Reviewed: 11/10/2011 ExitCare Patient Information 2015 [employer]. This information is not intended to replace advice given to you by your health care provider. Make sure you discuss any questions you have with your health care provider.  

## 2014-06-09 NOTE — Progress Notes (Signed)
Feeling a little better.

## 2014-06-09 NOTE — Progress Notes (Signed)
Pt feeling better. Got up to get in wheelchair  And started to get diaphoretic and felt chest pressure. B/p dropped again to 80/60 had. assited pt back bed and put head down.  Notified R.Dawson,CNM. Fluid bolus ordered IV restarted

## 2014-06-09 NOTE — Progress Notes (Signed)
Pt got up to get dressed and started feeling nauseated and ringing in her ears. Had pt lay down and cool clot applied

## 2014-06-12 ENCOUNTER — Inpatient Hospital Stay (HOSPITAL_COMMUNITY)
Admission: AD | Admit: 2014-06-12 | Discharge: 2014-06-12 | Disposition: A | Discharge status: Home / Self Care | Payer: BC Managed Care – PPO | Source: Ambulatory Visit | Attending: Obstetrics & Gynecology | Admitting: Obstetrics & Gynecology

## 2014-06-12 ENCOUNTER — Encounter (HOSPITAL_COMMUNITY): Payer: Self-pay | Admitting: *Deleted

## 2014-06-12 DIAGNOSIS — D649 Anemia, unspecified: Secondary | ICD-10-CM | POA: Diagnosis not present

## 2014-06-12 DIAGNOSIS — R252 Cramp and spasm: Secondary | ICD-10-CM | POA: Insufficient documentation

## 2014-06-12 DIAGNOSIS — F17200 Nicotine dependence, unspecified, uncomplicated: Secondary | ICD-10-CM | POA: Diagnosis not present

## 2014-06-12 DIAGNOSIS — R51 Headache: Secondary | ICD-10-CM | POA: Diagnosis not present

## 2014-06-12 DIAGNOSIS — D62 Acute posthemorrhagic anemia: Secondary | ICD-10-CM

## 2014-06-12 DIAGNOSIS — R5381 Other malaise: Secondary | ICD-10-CM | POA: Insufficient documentation

## 2014-06-12 DIAGNOSIS — E860 Dehydration: Secondary | ICD-10-CM

## 2014-06-12 DIAGNOSIS — R5383 Other fatigue: Secondary | ICD-10-CM

## 2014-06-12 LAB — COMPREHENSIVE METABOLIC PANEL
ALT: 5 U/L (ref 0–35)
AST: 13 U/L (ref 0–37)
Albumin: 3.3 g/dL — ABNORMAL LOW (ref 3.5–5.2)
Alkaline Phosphatase: 40 U/L (ref 39–117)
Anion gap: 10 (ref 5–15)
BUN: 8 mg/dL (ref 6–23)
CHLORIDE: 104 meq/L (ref 96–112)
CO2: 25 mEq/L (ref 19–32)
CREATININE: 0.7 mg/dL (ref 0.50–1.10)
Calcium: 8.8 mg/dL (ref 8.4–10.5)
GFR calc Af Amer: >90 mL/min (ref >90)
Glucose, Bld: 97 mg/dL (ref 70–99)
Potassium: 4.2 mEq/L (ref 3.7–5.3)
Sodium: 139 mEq/L (ref 137–147)
Total Protein: 6.3 g/dL (ref 6.0–8.3)

## 2014-06-12 LAB — TSH: TSH: 1.6 u[IU]/mL (ref 0.350–4.500)

## 2014-06-12 LAB — URINALYSIS, ROUTINE W REFLEX MICROSCOPIC
Bilirubin Urine: NEGATIVE
GLUCOSE, UA: NEGATIVE mg/dL
Ketones, ur: NEGATIVE mg/dL
Leukocytes, UA: NEGATIVE
Nitrite: NEGATIVE
Protein, ur: NEGATIVE mg/dL
SPECIFIC GRAVITY, URINE: 1.01 (ref 1.005–1.030)
Urobilinogen, UA: 0.2 mg/dL (ref 0.0–1.0)
pH: 5 (ref 5.0–8.0)

## 2014-06-12 LAB — CBC
HEMATOCRIT: 24.6 % — AB (ref 36.0–46.0)
Hemoglobin: 8.2 g/dL — ABNORMAL LOW (ref 12.0–15.0)
MCH: 31.5 pg (ref 26.0–34.0)
MCHC: 33.3 g/dL (ref 30.0–36.0)
MCV: 94.6 fL (ref 78.0–100.0)
PLATELETS: 223 10*3/uL (ref 150–400)
RBC: 2.6 MIL/uL — AB (ref 3.87–5.11)
RDW: 11.9 % (ref 11.5–15.5)
WBC: 7.7 10*3/uL (ref 4.0–10.5)

## 2014-06-12 LAB — URINE MICROSCOPIC-ADD ON

## 2014-06-12 MED ORDER — POLYSACCHARIDE IRON COMPLEX 150 MG PO CAPS
150.0000 mg | ORAL_CAPSULE | Freq: Two times a day (BID) | ORAL | Status: DC
  Filled 2014-06-12 (×3): qty 1

## 2014-06-12 MED ORDER — MAGNESIUM OXIDE 400 (241.3 MG) MG PO TABS
200.0000 mg | ORAL_TABLET | Freq: Two times a day (BID) | ORAL | Status: DC
  Filled 2014-06-12 (×3): qty 0.5

## 2014-06-12 MED ORDER — MAGNESIUM OXIDE 400 (241.3 MG) MG PO TABS: 200.0000 mg | ORAL_TABLET | Freq: Two times a day (BID) | ORAL | Status: DC

## 2014-06-12 MED ORDER — POLYSACCHARIDE IRON COMPLEX 150 MG PO CAPS: 150.0000 mg | ORAL_CAPSULE | Freq: Two times a day (BID) | ORAL | Status: DC

## 2014-06-12 NOTE — Discharge Instructions (Signed)
Anemia, Nonspecific Anemia is a condition in which the concentration of red blood cells or hemoglobin in the blood is below normal. Hemoglobin is a substance in red blood cells that carries oxygen to the tissues of the body. Anemia results in not enough oxygen reaching these tissues.  CAUSES  Common causes of anemia include:   Excessive bleeding. Bleeding may be internal or external. This includes excessive bleeding from periods (in women) or from the intestine.   Poor nutrition.   Chronic kidney, thyroid, and liver disease.  Bone marrow disorders that decrease red blood cell production.  Cancer and treatments for cancer.  HIV, AIDS, and their treatments.  Spleen problems that increase red blood cell destruction.  Blood disorders.  Excess destruction of red blood cells due to infection, medicines, and autoimmune disorders. SIGNS AND SYMPTOMS   Minor weakness.   Dizziness.   Headache.  Palpitations.   Shortness of breath, especially with exercise.   Paleness.  Cold sensitivity.  Indigestion.  Nausea.  Difficulty sleeping.  Difficulty concentrating. Symptoms may occur suddenly or they may develop slowly.  DIAGNOSIS  Additional blood tests are often needed. These help your health care provider determine the best treatment. Your health care provider will check your stool for blood and look for other causes of blood loss.  TREATMENT  Treatment varies depending on the cause of the anemia. Treatment can include:   Supplements of iron, vitamin B12, or folic acid.   Hormone medicines.   A blood transfusion. This may be needed if blood loss is severe.   Hospitalization. This may be needed if there is significant continual blood loss.   Dietary changes.  Spleen removal. HOME CARE INSTRUCTIONS Keep all follow-up appointments. It often takes many weeks to correct anemia, and having your health care provider check on your condition and your response to  treatment is very important. SEEK IMMEDIATE MEDICAL CARE IF:   You develop extreme weakness, shortness of breath, or chest pain.   You become dizzy or have trouble concentrating.  You develop heavy vaginal bleeding.   You develop a rash.   You have bloody or black, tarry stools.   You faint.   You vomit up blood.   You vomit repeatedly.   You have abdominal pain.  You have a fever or persistent symptoms for more than 2-3 days.   You have a fever and your symptoms suddenly get worse.   You are dehydrated.  MAKE SURE YOU:  Understand these instructions.  Will watch your condition.  Will get help right away if you are not doing well or get worse. Document Released: 10/29/2004 Document Revised: 05/24/2013 Document Reviewed: 03/17/2013 [facility] Patient Information 2015 [place], [place]. This information is not intended to replace advice given to you by your health care provider. Make sure you discuss any questions you have with your health care provider.  Dehydration, Adult Dehydration means your body does not have as much fluid as it needs. Your kidneys, brain, and heart will not work properly without the right amount of fluids and salt.  HOME CARE  Ask your doctor how to replace body fluid losses (rehydrate).  Drink enough fluids to keep your pee (urine) clear or pale yellow.  Drink small amounts of fluids often if you feel sick to your stomach (nauseous) or throw up (vomit).  Eat like you normally do.  Avoid:  Foods or drinks high in sugar.  Bubbly (carbonated) drinks.  Juice.  Very hot or cold fluids.  Drinks with  caffeine.  Fatty, greasy foods.  Alcohol.  Tobacco.  Eating too much.  Gelatin desserts.  Wash your hands to avoid spreading germs (bacteria, viruses).  Only take medicine as told by your doctor.  Keep all doctor visits as told. GET HELP RIGHT AWAY IF:   You cannot drink something without throwing up.  You get worse even  with treatment.  Your vomit has blood in it or looks greenish.  Your poop (stool) has blood in it or looks black and tarry.  You have not peed in 6 to 8 hours.  You pee a small amount of very dark pee.  You have a fever.  You pass out (faint).  You have belly (abdominal) pain that gets worse or stays in one spot (localizes).  You have a rash, stiff neck, or bad headache.  You get easily annoyed, sleepy, or are hard to wake up.  You feel weak, dizzy, or very thirsty. MAKE SURE YOU:   Understand these instructions.  Will watch your condition.  Will get help right away if you are not doing well or get worse. Document Released: 07/18/2009 Document Revised: 12/14/2011 Document Reviewed: 05/11/2011 [facility] Patient Information 2015 [place], [place]. This information is not intended to replace advice given to you by your health care provider. Make sure you discuss any questions you have with your health care provider.

## 2014-06-12 NOTE — MAU Note (Addendum)
This past Sat, was dx with a SAB, had a lot of bleeding. BP was low.  was dc'd given Methergine.  Was at work, feeling real weak, bp was 97/70. Called office.  Left arm and hand, feel heavy and kind of tingly

## 2014-06-12 NOTE — Progress Notes (Signed)
Fabian November notified of pt's admission and status. Aware of c/o of h/a, leg pain, L arm heavy with occ numbness today. V/S WNL. Will see pt

## 2014-06-12 NOTE — Progress Notes (Signed)
Donette Larry CNM in to see pt and discuss test results and d/c plan. Written and verbal d/c instructions given and understanding voiced.

## 2014-06-12 NOTE — MAU Provider Note (Signed)
History     CSN: [id number]  Arrival date and time: 06/12/14 1124 Notified of pt arrival  Provider at bedside      No chief complaint on file.  HPI Comments: [id number] here for weakness, leg cramps, and HA x3 days. Seen 3 days ago in MAU with VB and completed SAB. No SOB, chest tightness, or dizziness. No fever or chills. Checked BP while at work this am-97/70. Admits to poor hydration today.    OB History   Grav Para Term Preterm Abortions TAB SAB Ect Mult Living   0 4 0 0 4      Past Medical History  Diagnosis Date  . No pertinent past medical history   . Medical history non-contributory     Past Surgical History  Procedure Laterality Date  . Breast lumpectomy    . Cesarean section      C/S x 1    Family History  Problem Relation Age of Onset  . Adopted: Yes  . Anesthesia problems Neg Hx   . Hypotension Neg Hx   . Malignant hyperthermia Neg Hx   . Pseudochol deficiency Neg Hx     History  Substance Use Topics  . Smoking status: Current Every Day Smoker -- 0.50 packs/day    Types: Cigarettes  . Smokeless tobacco: Never Used  . Alcohol Use: No    Allergies: No Known Allergies  Prescriptions prior to admission  Medication Sig Dispense Refill  . methylergonovine (METHERGINE) 0.2 MG tablet Take 1 tablet (0.2 mg total) by mouth 3 (three) times daily.  21 tablet  0  . Vitamin D, Ergocalciferol, (DRISDOL) 50000 UNITS CAPS capsule Take 50,000 Units by mouth every 7 (seven) days.       Marland Kitchen oxyCODONE-acetaminophen (PERCOCET) 7.5-325 MG per tablet Take 1 tablet by mouth every 4 (four) hours as needed for pain.  10 tablet  0    Review of Systems  Eyes: Negative.   Respiratory: Negative.   Cardiovascular: Negative.   Gastrointestinal: Negative.   Genitourinary: Negative.   Musculoskeletal: Negative.   Skin: Negative.   Neurological: Positive for weakness and headaches.  Endo/Heme/Allergies: Negative.   Psychiatric/Behavioral: Negative.     Physical Exam   Blood pressure 115/68, pulse 72, temperature 98.7 F (37.1 C), temperature source Oral, resp. rate 18, height  (1.6 m), weight 96.616 kg (213 lb), last menstrual period 03/24/2014, SpO2 100.00%, unknown if currently breastfeeding.  Physical Exam  Constitutional: She is oriented to person, place, and time. She appears well-developed and well-nourished.  HENT:  Head: Normocephalic and atraumatic.  Neck: Normal range of motion. Neck supple.  Cardiovascular: Normal rate and regular rhythm.   Respiratory: Effort normal and breath sounds normal.  GI: Soft. Bowel sounds are normal.  Genitourinary: Vagina normal.  Scant brown blood  Musculoskeletal: Normal range of motion.  Homans neg  Neurological: She is alert and oriented to person, place, and time.  Skin: Skin is warm and dry.  Psychiatric: She has a normal mood and affect.   Results for Ashley Ware, Ashley Ware (MRN [id number]) as of 06/12/2014 15:00  Ref. Range 06/12/2014 12:35 06/12/2014 13:35  Sodium Latest Range: 137-147 mEq/L 139   Potassium Latest Range: 3.7-5.3 mEq/L 4.2   Chloride Latest Range: 96-112 mEq/L 104   CO2 Latest Range: 19-32 mEq/L 25   BUN Latest Range: 6-23 mg/dL 8   Creatinine Latest Range: 0.50-1.10 mg/dL 9.62   Calcium Latest Range: 8.4-10.5 mg/dL 8.8   GFR calc  non Af Amer Latest Range: >90 mL/min >90   GFR calc Af Amer Latest Range: >90 mL/min >90   Glucose Latest Range: 70-99 mg/dL 97   Anion gap Latest Range: 5-15  10   Alkaline Phosphatase Latest Range: 39-117 U/L 40   Albumin Latest Range: 3.5-5.2 g/dL 3.3 (L)   AST Latest Range: 0-37 U/L 13   ALT Latest Range: 0-35 U/L 5   Total Protein Latest Range: 6.0-8.3 g/dL 6.3   Total Bilirubin Latest Range: 0.3-1.2 mg/dL <1.6 (L)   WBC Latest Range: 4.0-10.5 K/uL 7.7   RBC Latest Range: 3.87-5.11 MIL/uL 2.60 (L)   Hemoglobin Latest Range: 12.0-15.0 g/dL 8.2 (L)   HCT Latest Range: 36.0-46.0 % 24.6 (L)   MCV Latest Range: 78.0-100.0 fL 94.6    MCH Latest Range: 26.0-34.0 pg 31.5   MCHC Latest Range: 30.0-36.0 g/dL 10.9   RDW Latest Range: 11.5-15.5 % 11.9   Platelets Latest Range: 150-400 K/uL 223   Color, Urine Latest Range: YELLOW   YELLOW  APPearance Latest Range: CLEAR   CLEAR  Specific Gravity, Urine Latest Range: 1.005-1.030   1.010  pH Latest Range: 5.0-8.0   5.0  Glucose Latest Range: NEGATIVE mg/dL  NEGATIVE  Bilirubin Urine Latest Range: NEGATIVE   NEGATIVE  Ketones, ur Latest Range: NEGATIVE mg/dL  NEGATIVE  Protein Latest Range: NEGATIVE mg/dL  NEGATIVE  Urobilinogen, UA Latest Range: 0.0-1.0 mg/dL  0.2  Nitrite Latest Range: NEGATIVE   NEGATIVE  Leukocytes, UA Latest Range: NEGATIVE   NEGATIVE  Hgb urine dipstick Latest Range: NEGATIVE   TRACE (A)  WBC, UA Latest Range: <3 WBC/hpf  0-2  RBC / HPF Latest Range: <3 RBC/hpf  0-2  Squamous Epithelial / LPF Latest Range: RARE   FEW (A)  Bacteria, UA Latest Range: RARE   RARE  TSH 0.350 - 4.500 uIU/mL  1.600      MAU Course  Procedures  Po hydration Orthostatic VS-normal  Assessment and Plan  S/p SAB Anemia Dehydration  Discharge home Ferrex 150 mg po bid Magnesium Oxide 200 mg po bid Increase po hydration Increase Fe rich foods Follow-up with Dr. Seymour Bars in office in 2 weeks  Christene Pounds, N 06/12/2014, 2:19 PM

## 2014-08-06 ENCOUNTER — Encounter (HOSPITAL_COMMUNITY): Payer: Self-pay | Admitting: *Deleted

## 2015-02-11 ENCOUNTER — Encounter (HOSPITAL_COMMUNITY): Payer: Self-pay

## 2015-02-11 ENCOUNTER — Inpatient Hospital Stay (HOSPITAL_COMMUNITY)
Admission: AD | Admit: 2015-02-11 | Discharge: 2015-02-11 | Disposition: A | Discharge status: Home / Self Care | Payer: BLUE CROSS/BLUE SHIELD | Source: Ambulatory Visit | Attending: Obstetrics and Gynecology | Admitting: Obstetrics and Gynecology

## 2015-02-11 ENCOUNTER — Inpatient Hospital Stay (HOSPITAL_COMMUNITY): Payer: BLUE CROSS/BLUE SHIELD

## 2015-02-11 DIAGNOSIS — R1030 Lower abdominal pain, unspecified: Secondary | ICD-10-CM | POA: Diagnosis present

## 2015-02-11 DIAGNOSIS — R102 Pelvic and perineal pain: Secondary | ICD-10-CM | POA: Diagnosis not present

## 2015-02-11 DIAGNOSIS — N941 Unspecified dyspareunia: Secondary | ICD-10-CM

## 2015-02-11 LAB — URINE MICROSCOPIC-ADD ON

## 2015-02-11 LAB — POCT PREGNANCY, URINE: Preg Test, Ur: NEGATIVE

## 2015-02-11 LAB — WET PREP, GENITAL
Clue Cells Wet Prep HPF POC: NONE SEEN
Trich, Wet Prep: NONE SEEN
WBC, Wet Prep HPF POC: NONE SEEN
YEAST WET PREP: NONE SEEN

## 2015-02-11 LAB — GC/CHLAMYDIA PROBE AMP (~~LOC~~) NOT AT ARMC
Chlamydia: NEGATIVE
NEISSERIA GONORRHEA: NEGATIVE

## 2015-02-11 LAB — URINALYSIS, ROUTINE W REFLEX MICROSCOPIC
Bilirubin Urine: NEGATIVE
Glucose, UA: NEGATIVE mg/dL
Ketones, ur: NEGATIVE mg/dL
Leukocytes, UA: NEGATIVE
Nitrite: NEGATIVE
Protein, ur: NEGATIVE mg/dL
Specific Gravity, Urine: <1.005 — ABNORMAL LOW (ref 1.005–1.030)
Urobilinogen, UA: 0.2 mg/dL (ref 0.0–1.0)
pH: 6 (ref 5.0–8.0)

## 2015-02-11 MED ORDER — IBUPROFEN 600 MG PO TABS
600.0000 mg | ORAL_TABLET | Freq: Once | ORAL | Status: AC
Start: 2015-02-11 — End: 2015-02-11
  Administered 2015-02-11: 600 mg via ORAL
  Filled 2015-02-11: qty 1

## 2015-02-11 NOTE — MAU Provider Note (Signed)
History     CSN: [id number]  Arrival date and time: 02/11/15 0320   None     Chief Complaint  Patient presents with  . Abdominal Pain   HPI Comments: Non-pregnant female here for acute onset of constant sharp pelvic pain during IC with worsening after. Onset was around 0100. No bleeding or discharge. Did not take OTC for pain. Denies hx of STDs. Hx of ovarian cysts. Contracepting by spouse vasectomy. Regular menses q28 days, normal flow x4-5 days, LMP 01/25/15.    Past Medical History  Diagnosis Date  . No pertinent past medical history   . Preterm labor     Past Surgical History  Procedure Laterality Date  . Breast lumpectomy    . Cesarean section      C/S x 1    Family History  Problem Relation Age of Onset  . Adopted: Yes  . Anesthesia problems Neg Hx   . Hypotension Neg Hx   . Malignant hyperthermia Neg Hx   . Pseudochol deficiency Neg Hx     History  Substance Use Topics  . Smoking status: Current Every Day Smoker -- 0.50 packs/day    Types: Cigarettes  . Smokeless tobacco: Never Used  . Alcohol Use: No    Allergies: No Known Allergies  Prescriptions prior to admission  Medication Sig Dispense Refill Last Dose  . UNABLE TO FIND Take 10 drops by mouth 2 (two) times daily. Med Name: dietary supplement, "Res[employer]"   02/10/2015 at Unknown time    Review of Systems  Constitutional: Negative.   HENT: Negative.   Eyes: Negative.   Respiratory: Negative.   Cardiovascular: Negative.   Gastrointestinal: Negative.   Genitourinary: Negative for dysuria, urgency, frequency and hematuria.       +pelvic pain  Musculoskeletal: Negative.   Skin: Negative.   Neurological: Negative.   Endo/Heme/Allergies: Negative.   Psychiatric/Behavioral: Negative.    Physical Exam   Blood pressure 122/77, pulse 82, temperature 98.3 F (36.8 C), temperature source Oral, resp. rate 18, last menstrual period 01/23/2015, not currently  breastfeeding.  Physical Exam  Constitutional: She is oriented to person, place, and time. She appears well-developed and well-nourished.  HENT:  Head: Normocephalic and atraumatic.  Neck: Normal range of motion. Neck supple.  Cardiovascular: Normal rate and regular rhythm.   Respiratory: Effort normal and breath sounds normal.  GI: Soft. Bowel sounds are normal. She exhibits no distension. There is no tenderness. There is no rebound and no guarding.  Genitourinary: Vagina normal and uterus normal.  Speculum: normal parous os, scant thin white discharge, mucosa pink, intact, wet prep and cx collected Bimanual: normal, anteverted, no CMT, no adnexal mass, mod tenderness of left adnexa, mild rt adnexal tenderness  Musculoskeletal: Normal range of motion.  Neurological: She is alert and oriented to person, place, and time.  Skin: Skin is warm and dry.  Psychiatric: She has a normal mood and affect.    MAU Course  Procedures UA-neg UPT-neg                                    Study Result     CLINICAL DATA: Right greater than left pelvic pain radiating to the back and bottom. Negative urine pregnancy test today.  EXAM: TRANSABDOMINAL AND TRANSVAGINAL ULTRASOUND OF PELVIS  TECHNIQUE: Both transabdominal and transvaginal ultrasound examinations of the pelvis were performed. Transabdominal technique was performed for  global imaging of the pelvis including uterus, ovaries, adnexal regions, and pelvic cul-de-sac. It was necessary to proceed with endovaginal exam following the transabdominal exam to visualize the ovaries.  COMPARISON: 06/09/2014  FINDINGS: Uterus  Measurements: 8.4 x 4.4 x 6 cm, anteverted. No fibroids or other mass visualized.  Endometrium  Thickness: 9 mm. No focal abnormality visualized.  Right ovary  Measurements: 3.5 x 2.6 x 2.7 cm. Normal appearance/no adnexal mass.  Left ovary  Measurements: 2.6 x 1.8 x 2.2 cm. Normal  appearance/no adnexal mass.  Other findings  Small amount of free fluid in the pelvis.  IMPRESSION: Normal ultrasound appearance of the uterus and ovaries. Normal follicular changes in the ovaries. Small amount of free fluid is likely physiologic.   Ibuprofen 600 mg po x1-improvement of pain wet prep-neg Gc/CMT-pending  Assessment and Plan  Post coital pelvic pain-unknown etiology  Discharge home Ibuprofen 600 mg po q6 hrs prn Warm baths prn Follow-up in 1-2 with Dr. Tillman AbideLavoie   Bohden Dung, N 02/11/2015, 5:11 AM

## 2015-02-11 NOTE — Discharge Instructions (Signed)
Ovarian Cyst An ovarian cyst is a fluid-filled sac that forms on an ovary. The ovaries are small organs that produce eggs in women. Various types of cysts can form on the ovaries. Most are not cancerous. Many do not cause problems, and they often go away on their own. Some may cause symptoms and require treatment. Common types of ovarian cysts include:  Functional cysts--These cysts may occur every month during the menstrual cycle. This is normal. The cysts usually go away with the next menstrual cycle if the woman does not get pregnant. Usually, there are no symptoms with a functional cyst.  Endometrioma cysts--These cysts form from the tissue that lines the uterus. They are also called "chocolate cysts" because they become filled with blood that turns brown. This type of cyst can cause pain in the lower abdomen during intercourse and with your menstrual period.  Cystadenoma cysts--This type develops from the cells on the outside of the ovary. These cysts can get very big and cause lower abdomen pain and pain with intercourse. This type of cyst can twist on itself, cut off its blood supply, and cause severe pain. It can also easily rupture and cause a lot of pain.  Dermoid cysts--This type of cyst is sometimes found in both ovaries. These cysts may contain different kinds of body tissue, such as skin, teeth, hair, or cartilage. They usually do not cause symptoms unless they get very big.  Theca lutein cysts--These cysts occur when too much of a certain hormone (human chorionic gonadotropin) is produced and overstimulates the ovaries to produce an egg. This is most common after procedures used to assist with the conception of a baby (in vitro fertilization). CAUSES   Fertility drugs can cause a condition in which multiple large cysts are formed on the ovaries. This is called ovarian hyperstimulation syndrome.  A condition called polycystic ovary syndrome can cause hormonal imbalances that can lead to  nonfunctional ovarian cysts. SIGNS AND SYMPTOMS  Many ovarian cysts do not cause symptoms. If symptoms are present, they may include:  Pelvic pain or pressure.  Pain in the lower abdomen.  Pain during sexual intercourse.  Increasing girth (swelling) of the abdomen.  Abnormal menstrual periods.  Increasing pain with menstrual periods.  Stopping having menstrual periods without being pregnant. DIAGNOSIS  These cysts are commonly found during a routine or annual pelvic exam. Tests may be ordered to find out more about the cyst. These tests may include:  Ultrasound.  X-ray of the pelvis.  CT scan.  MRI.  Blood tests. TREATMENT  Many ovarian cysts go away on their own without treatment. Your health care provider may want to check your cyst regularly for 2-3 months to see if it changes. For women in menopause, it is particularly important to monitor a cyst closely because of the higher rate of ovarian cancer in menopausal women. When treatment is needed, it may include any of the following:  A procedure to drain the cyst (aspiration). This may be done using a long needle and ultrasound. It can also be done through a laparoscopic procedure. This involves using a thin, lighted tube with a tiny camera on the end (laparoscope) inserted through a small incision.  Surgery to remove the whole cyst. This may be done using laparoscopic surgery or an open surgery involving a larger incision in the lower abdomen.  Hormone treatment or birth control pills. These methods are sometimes used to help dissolve a cyst. HOME CARE INSTRUCTIONS   Only take over-the-counter   or prescription medicines as directed by your health care provider.  Follow up with your health care provider as directed.  Get regular pelvic exams and Pap tests. SEEK MEDICAL CARE IF:   Your periods are late, irregular, or painful, or they stop.  Your pelvic pain or abdominal pain does not go away.  Your abdomen becomes  larger or swollen.  You have pressure on your bladder or trouble emptying your bladder completely.  You have pain during sexual intercourse.  You have feelings of fullness, pressure, or discomfort in your stomach.  You lose weight for no apparent reason.  You feel generally ill.  You become constipated.  You lose your appetite.  You develop acne.  You have an increase in body and facial hair.  You are gaining weight, without changing your exercise and eating habits.  You think you are pregnant. SEEK IMMEDIATE MEDICAL CARE IF:   You have increasing abdominal pain.  You feel sick to your stomach (nauseous), and you throw up (vomit).  You develop a fever that comes on suddenly.  You have abdominal pain during a bowel movement.  Your menstrual periods become heavier than usual. MAKE SURE YOU:  Understand these instructions.  Will watch your condition.  Will get help right away if you are not doing well or get worse. Document Released: 09/21/2005 Document Revised: 09/26/2013 Document Reviewed: 05/29/2013 [employer] Patient Information 2015 [employer]. This information is not intended to replace advice given to you by your health care provider. Make sure you discuss any questions you have with your health care provider.  

## 2015-02-11 NOTE — MAU Note (Signed)
Lower abdominal pain s/p intercourse this morning around 1am. Denies vaginal bleeding or vaginal discharge. LMP 01/23/15. Denies urinary complaints. One episode of diarrhea tonight. [facility]

## 2017-10-10 DIAGNOSIS — J209 Acute bronchitis, unspecified: Secondary | ICD-10-CM | POA: Diagnosis not present

## 2018-01-17 ENCOUNTER — Ambulatory Visit (HOSPITAL_COMMUNITY)
Admission: EM | Admit: 2018-01-17 | Discharge: 2018-01-17 | Disposition: A | Discharge status: Home / Self Care | Payer: BLUE CROSS/BLUE SHIELD | Attending: Internal Medicine | Admitting: Internal Medicine

## 2018-01-17 ENCOUNTER — Encounter (HOSPITAL_COMMUNITY): Payer: Self-pay | Admitting: Emergency Medicine

## 2018-01-17 ENCOUNTER — Ambulatory Visit (INDEPENDENT_AMBULATORY_CARE_PROVIDER_SITE_OTHER): Payer: BLUE CROSS/BLUE SHIELD

## 2018-01-17 DIAGNOSIS — S90122A Contusion of left lesser toe(s) without damage to nail, initial encounter: Secondary | ICD-10-CM

## 2018-01-17 DIAGNOSIS — S99922A Unspecified injury of left foot, initial encounter: Secondary | ICD-10-CM | POA: Diagnosis not present

## 2018-01-17 MED ORDER — NAPROXEN 500 MG PO TABS: 500.0000 mg | ORAL_TABLET | Freq: Two times a day (BID) | ORAL | 0 refills | Status: AC

## 2018-01-17 NOTE — ED Triage Notes (Signed)
Pt sts dropped box on left foot x 2 days ago with continued pain

## 2018-01-17 NOTE — Discharge Instructions (Addendum)
Xray of your left foot/toes today was negative for bony injury/fracture.  Pain and swelling should gradually improve over the next several days.  There may be some residual discomfort for several weeks.  A prescription for naproxen, anti inflammatory/pain reliever, was sent to the pharmacy.  It may be helpful to wear a postop boot to decrease pain related to movement.  Ice for 5-10 minutes several times daily may also help decrease swelling and pain.  Note for work today.  Recheck or followup with a foot doctor if not improving as outlined above.

## 2018-01-17 NOTE — ED Provider Notes (Signed)
[facility]    CSN: [id number] Arrival date & time: 01/17/18  1114     History   Chief Complaint Chief Complaint  Patient presents with  . Foot Pain    HPI Ashley Ware is a 37 y.o. female.   Patient presents today with pain, swelling and bruising in her left fourth and fifth toes.  She started a new job at [employer], and says 3 days ago her supervisor dropped a box on her left foot.  She has been walking around on it, but it is painful.  No other injuries reported.    HPI  Past Medical History:  Diagnosis Date  . No pertinent past medical history   . Preterm labor     Past Surgical History:  Procedure Laterality Date  . BREAST LUMPECTOMY    . CESAREAN SECTION     C/S x 1    OB History    Gravida  8   Para  4   Term  2   Preterm  2   AB  4   Living  4     SAB  4   TAB  0   Ectopic  0   Multiple  0   Live Births  2            Home Medications    Prior to Admission medications   Medication Sig Start Date End Date Taking? Authorizing Provider  naproxen (NAPROSYN) 500 MG tablet Take 1 tablet (500 mg total) by mouth 2 (two) times daily for 14 days. 01/17/18 01/31/18  Isa Rankin, MD  UNABLE TO FIND Take 10 drops by mouth 2 (two) times daily. Med Name: dietary supplement, "Res[employer]"    [provider]    Family History Family History  Adopted: Yes  Problem Relation Age of Onset  . Anesthesia problems Neg Hx   . Hypotension Neg Hx   . Malignant hyperthermia Neg Hx   . Pseudochol deficiency Neg Hx     Social History Social History   Tobacco Use  . Smoking status: Current Every Day Smoker    Packs/day: 0.50    Types: Cigarettes  . Smokeless tobacco: Never Used  Substance Use Topics  . Alcohol use: No  . Drug use: No     Allergies   Patient has no known allergies.   Review of Systems Review of Systems  All other systems reviewed and are negative.    Physical  Exam Triage Vital Signs ED Triage Vitals [01/17/18 1146]  Enc Vitals Group     BP 117/71     Pulse Rate 88     Resp 18     Temp 98.4 F (36.9 C)     Temp Source Oral     SpO2 100 %     Weight      Height      Pain Score      Pain Loc    Updated Vital Signs BP 117/71 (BP Location: Left Arm)   Pulse 88   Temp 98.4 F (36.9 C) (Oral)   Resp 18   SpO2 100%  Physical Exam  Constitutional: She is oriented to person, place, and time. No distress.  HENT:  Head: Atraumatic.  Eyes:  Conjugate gaze observed, no eye redness/discharge  Neck: Neck supple.  Cardiovascular: Normal rate.  Pulmonary/Chest: No respiratory distress.  Abdominal: She exhibits no distension.  Musculoskeletal: Normal range of motion.  Feet:  Left fourth and fifth toes, especially distally, are bruised and slightly swollen.  Skin is intact.  Able to move the toes, but diffusely quite tender to touch, with tender zone extending onto the dorsal foot.  Neurological: She is alert and oriented to person, place, and time.  Skin: Skin is warm and dry.  Nursing note and vitals reviewed.    UC Treatments / Results  Labs (all labs ordered are listed, but only abnormal results are displayed) Labs Reviewed - No data to display  EKG None Radiology Dg Foot Complete Left  Result Date: 01/17/2018 CLINICAL DATA:  Heavy object dropped on foot EXAM: LEFT FOOT - COMPLETE 3+ VIEW COMPARISON:  Feb 03, 2007 FINDINGS: Frontal, oblique, and lateral views were obtained. There is no demonstrable fracture or dislocation. Joint spaces appear normal. No erosive change. There is a small posterior calcaneal spur. IMPRESSION: Small posterior calcaneal spur. No fracture or dislocation. No appreciable arthropathy. Electronically Signed   By: Bretta BangWilliam  Woodruff III M.D.   On: 01/17/2018 12:02    Procedures Procedures (including critical care time) None today  Final Clinical Impressions(s) / UC Diagnoses   Final diagnoses:   Contusion of fourth toe of left foot, initial encounter  Contusion of fifth toe of left foot, initial encounter   Xray of your left foot/toes today was negative for bony injury/fracture.  Pain and swelling should gradually improve over the next several days.  There may be some residual discomfort for several weeks.  A prescription for naproxen, anti inflammatory/pain reliever, was sent to the pharmacy.  It may be helpful to wear a postop boot to decrease pain related to movement.  Ice for 5-10 minutes several times daily may also help decrease swelling and pain.  Note for work today.  Recheck or followup with a foot doctor if not improving as outlined above.  ED Discharge Orders        Ordered    naproxen (NAPROSYN) 500 MG tablet  2 times daily     01/17/18 1224        Isa RankinMurray, Kallon Caylor Wilson, MD 01/20/18 [phone]

## 2018-06-10 DIAGNOSIS — L03312 Cellulitis of back [any part except buttock]: Secondary | ICD-10-CM | POA: Diagnosis not present

## 2018-06-17 DIAGNOSIS — L03312 Cellulitis of back [any part except buttock]: Secondary | ICD-10-CM | POA: Diagnosis not present

## 2018-06-17 DIAGNOSIS — Z0000 Encounter for general adult medical examination without abnormal findings: Secondary | ICD-10-CM | POA: Diagnosis not present

## 2018-06-17 DIAGNOSIS — Z23 Encounter for immunization: Secondary | ICD-10-CM | POA: Diagnosis not present

## 2018-06-17 DIAGNOSIS — G43011 Migraine without aura, intractable, with status migrainosus: Secondary | ICD-10-CM | POA: Diagnosis not present

## 2018-06-17 DIAGNOSIS — R200 Anesthesia of skin: Secondary | ICD-10-CM | POA: Diagnosis not present

## 2018-11-28 ENCOUNTER — Other Ambulatory Visit: Payer: Self-pay

## 2018-11-28 ENCOUNTER — Emergency Department (HOSPITAL_COMMUNITY): Payer: Self-pay

## 2018-11-28 ENCOUNTER — Emergency Department (HOSPITAL_COMMUNITY)
Admission: EM | Admit: 2018-11-28 | Discharge: 2018-11-28 | Disposition: A | Discharge status: Home / Self Care | Payer: Self-pay | Attending: Emergency Medicine | Admitting: Emergency Medicine

## 2018-11-28 ENCOUNTER — Encounter (HOSPITAL_COMMUNITY): Payer: Self-pay

## 2018-11-28 DIAGNOSIS — K529 Noninfective gastroenteritis and colitis, unspecified: Secondary | ICD-10-CM | POA: Insufficient documentation

## 2018-11-28 DIAGNOSIS — Z87891 Personal history of nicotine dependence: Secondary | ICD-10-CM | POA: Insufficient documentation

## 2018-11-28 DIAGNOSIS — R197 Diarrhea, unspecified: Secondary | ICD-10-CM | POA: Diagnosis not present

## 2018-11-28 LAB — CBC WITH DIFFERENTIAL/PLATELET
ABS IMMATURE GRANULOCYTES: 0.02 10*3/uL (ref 0.00–0.07)
BASOS PCT: 0 %
Basophils Absolute: 0 10*3/uL (ref 0.0–0.1)
Eosinophils Absolute: 0 10*3/uL (ref 0.0–0.5)
Eosinophils Relative: 0 %
HCT: 39.1 % (ref 36.0–46.0)
Hemoglobin: 12.6 g/dL (ref 12.0–15.0)
Immature Granulocytes: 0 %
Lymphocytes Relative: 16 %
Lymphs Abs: 1.4 10*3/uL (ref 0.7–4.0)
MCH: 30.7 pg (ref 26.0–34.0)
MCHC: 32.2 g/dL (ref 30.0–36.0)
MCV: 95.1 fL (ref 80.0–100.0)
Monocytes Absolute: 0.6 10*3/uL (ref 0.1–1.0)
Monocytes Relative: 7 %
NEUTROS ABS: 6.7 10*3/uL (ref 1.7–7.7)
NEUTROS PCT: 77 %
NRBC: 0 % (ref 0.0–0.2)
PLATELETS: 189 10*3/uL (ref 150–400)
RBC: 4.11 MIL/uL (ref 3.87–5.11)
RDW: 12.5 % (ref 11.5–15.5)
WBC: 8.8 10*3/uL (ref 4.0–10.5)

## 2018-11-28 LAB — URINALYSIS, ROUTINE W REFLEX MICROSCOPIC
BACTERIA UA: NONE SEEN
Bilirubin Urine: NEGATIVE
Glucose, UA: NEGATIVE mg/dL
KETONES UR: 80 mg/dL — AB
Leukocytes,Ua: NEGATIVE
Nitrite: NEGATIVE
PROTEIN: NEGATIVE mg/dL
pH: 6 (ref 5.0–8.0)

## 2018-11-28 LAB — LIPASE, BLOOD: LIPASE: 25 U/L (ref 11–51)

## 2018-11-28 LAB — COMPREHENSIVE METABOLIC PANEL
ALT: 10 U/L (ref 0–44)
ANION GAP: 8 (ref 5–15)
AST: 12 U/L — AB (ref 15–41)
Albumin: 4.1 g/dL (ref 3.5–5.0)
Alkaline Phosphatase: 45 U/L (ref 38–126)
BILIRUBIN TOTAL: 1.3 mg/dL — AB (ref 0.3–1.2)
BUN: 14 mg/dL (ref 6–20)
CHLORIDE: 108 mmol/L (ref 98–111)
CO2: 22 mmol/L (ref 22–32)
Calcium: 8.6 mg/dL — ABNORMAL LOW (ref 8.9–10.3)
Creatinine, Ser: 0.61 mg/dL (ref 0.44–1.00)
GFR calc non Af Amer: >60 mL/min (ref >60)
Glucose, Bld: 105 mg/dL — ABNORMAL HIGH (ref 70–99)
Potassium: 3.9 mmol/L (ref 3.5–5.1)
Sodium: 138 mmol/L (ref 135–145)
TOTAL PROTEIN: 8.1 g/dL (ref 6.5–8.1)

## 2018-11-28 LAB — I-STAT BETA HCG BLOOD, ED (MC, WL, AP ONLY): I-stat hCG, quantitative: <5 m[IU]/mL (ref <5)

## 2018-11-28 MED ORDER — IOPAMIDOL (ISOVUE-300) INJECTION 61%
INTRAVENOUS | Status: AC
  Filled 2018-11-28: qty 100

## 2018-11-28 MED ORDER — MORPHINE SULFATE (PF) 4 MG/ML IV SOLN
4.0000 mg | Freq: Once | INTRAVENOUS | Status: AC
  Administered 2018-11-28: 4 mg via INTRAVENOUS
  Filled 2018-11-28: qty 1

## 2018-11-28 MED ORDER — SODIUM CHLORIDE (PF) 0.9 % IJ SOLN
INTRAMUSCULAR | Status: AC
  Filled 2018-11-28: qty 50

## 2018-11-28 MED ORDER — TRAMADOL HCL 50 MG PO TABS: 50.0000 mg | ORAL_TABLET | Freq: Four times a day (QID) | ORAL | 0 refills | Status: DC | PRN

## 2018-11-28 MED ORDER — DICYCLOMINE HCL 20 MG PO TABS: 20.0000 mg | ORAL_TABLET | Freq: Two times a day (BID) | ORAL | 0 refills | Status: DC

## 2018-11-28 MED ORDER — ONDANSETRON HCL 4 MG/2ML IJ SOLN
4.0000 mg | Freq: Once | INTRAMUSCULAR | Status: AC
  Administered 2018-11-28: 4 mg via INTRAVENOUS
  Filled 2018-11-28: qty 2

## 2018-11-28 MED ORDER — AMOXICILLIN-POT CLAVULANATE 875-125 MG PO TABS: 1.0000 | ORAL_TABLET | Freq: Two times a day (BID) | ORAL | 0 refills | Status: DC

## 2018-11-28 MED ORDER — SODIUM CHLORIDE 0.9 % IV BOLUS
1000.0000 mL | Freq: Once | INTRAVENOUS | Status: AC
  Administered 2018-11-28: 1000 mL via INTRAVENOUS

## 2018-11-28 MED ORDER — IOPAMIDOL (ISOVUE-300) INJECTION 61%
100.0000 mL | Freq: Once | INTRAVENOUS | Status: AC | PRN
  Administered 2018-11-28: 100 mL via INTRAVENOUS

## 2018-11-28 MED ORDER — ONDANSETRON 4 MG PO TBDP: 4.0000 mg | ORAL_TABLET | Freq: Three times a day (TID) | ORAL | 0 refills | Status: DC | PRN

## 2018-11-28 MED ORDER — DICYCLOMINE HCL 10 MG/ML IM SOLN
20.0000 mg | Freq: Once | INTRAMUSCULAR | Status: AC
  Administered 2018-11-28: 20 mg via INTRAMUSCULAR
  Filled 2018-11-28: qty 2

## 2018-11-28 NOTE — ED Notes (Signed)
ED Provider at bedside. 

## 2018-11-28 NOTE — ED Notes (Addendum)
EKG Given to Dr. Dalene Seltzer

## 2018-11-28 NOTE — ED Notes (Signed)
Pt is alert and oriented x 4 and is verbally responsive. PT is comfortable at this time 0/10 pain. Pt dtr is at bedside. Pt made aware urine specimen is needed.

## 2018-11-28 NOTE — ED Notes (Signed)
Only able to get enough blood for light green.

## 2018-11-28 NOTE — ED Provider Notes (Signed)
[facility]EMERGENCY [facility]PT Provider Note   CSN: [id number] Arrival date & time: 11/28/18  1005    History   Chief Complaint Chief Complaint  Patient presents with  . Abdominal Pain    HPI Ashley Ware is a 38 y.o. female.     HPI  Presents with concern for right sided lower abdominal pain Chills, body aches, sweating, nausea No diarrhea, low appetite Fever to 101 on Saturday, hasn't checked since then Pain not worsened by eating. Hurts to cough, not having significant cough. No hx of DVT/PE , no hx of afib.   Past Medical History:  Diagnosis Date  . No pertinent past medical history   . Preterm labor     There are no active problems to display for this patient.   Past Surgical History:  Procedure Laterality Date  . BREAST LUMPECTOMY    . CESAREAN SECTION     C/S x 1     OB History    Gravida  8   Para  4   Term  2   Preterm  2   AB  4   Living  4     SAB  4   TAB  0   Ectopic  0   Multiple  0   Live Births  2            Home Medications    Prior to Admission medications   Medication Sig Start Date End Date Taking? Authorizing Provider  amoxicillin-clavulanate (AUGMENTIN) 875-125 MG tablet Take 1 tablet by mouth every 12 (twelve) hours. 11/28/18   Alvira Monday, MD  dicyclomine (BENTYL) 20 MG tablet Take 1 tablet (20 mg total) by mouth 2 (two) times daily. 11/28/18   Alvira Monday, MD  ondansetron (ZOFRAN ODT) 4 MG disintegrating tablet Take 1 tablet (4 mg total) by mouth every 8 (eight) hours as needed for nausea or vomiting. 11/28/18   Alvira Monday, MD  traMADol (ULTRAM) 50 MG tablet Take 1 tablet (50 mg total) by mouth every 6 (six) hours as needed. 11/28/18   Alvira Monday, MD    Family History Family History  Adopted: Yes  Problem Relation Age of Onset  . Anesthesia problems Neg Hx   . Hypotension Neg Hx   . Malignant hyperthermia Neg Hx   . Pseudochol deficiency Neg Hx     Social  History Social History   Tobacco Use  . Smoking status: Former Smoker    Types: Cigarettes  . Smokeless tobacco: Never Used  Substance Use Topics  . Alcohol use: No  . Drug use: No     Allergies   Patient has no known allergies.   Review of Systems Review of Systems  Constitutional: Positive for appetite change, fatigue and fever.  HENT: Negative for sore throat.   Eyes: Negative for visual disturbance.  Respiratory: Negative for cough and shortness of breath.   Cardiovascular: Negative for chest pain.  Gastrointestinal: Positive for abdominal pain and nausea. Negative for diarrhea and vomiting.  Genitourinary: Negative for difficulty urinating, dysuria and vaginal bleeding.  Musculoskeletal: Positive for back pain (chronic).  Skin: Negative for rash.  Neurological: Negative for syncope and headaches.     Physical Exam Updated Vital Signs BP (!) 111/58 (BP Location: Right Arm)   Pulse (!) 58   Temp 99 F (37.2 C) (Oral)   Resp 16   Ht 5\' 3"  (1.6 m)   Wt 94 kg   LMP 10/28/2018 Comment: negative HCG blood  test 11-28-2018  SpO2 99%   BMI 36.71 kg/m   Physical Exam Vitals signs and nursing note reviewed.  Constitutional:      General: She is not in acute distress.    Appearance: She is well-developed. She is not diaphoretic.  HENT:     Head: Normocephalic and atraumatic.  Eyes:     Conjunctiva/sclera: Conjunctivae normal.  Neck:     Musculoskeletal: Normal range of motion.  Cardiovascular:     Rate and Rhythm: Normal rate and regular rhythm.     Heart sounds: Normal heart sounds. No murmur. No friction rub. No gallop.   Pulmonary:     Effort: Pulmonary effort is normal. No respiratory distress.     Breath sounds: Normal breath sounds. No wheezing or rales.  Abdominal:     General: There is no distension.     Palpations: Abdomen is soft.     Tenderness: There is abdominal tenderness in the right upper quadrant, right lower quadrant and epigastric area.  There is no guarding. Positive signs include McBurney's sign. Negative signs include Murphy's sign.  Musculoskeletal:        General: No tenderness.  Skin:    General: Skin is warm and dry.     Findings: No erythema or rash.  Neurological:     Mental Status: She is alert and oriented to person, place, and time.      ED Treatments / Results  Labs (all labs ordered are listed, but only abnormal results are displayed) Labs Reviewed  URINALYSIS, ROUTINE W REFLEX MICROSCOPIC - Abnormal; Notable for the following components:      Result Value   Specific Gravity, Urine >1.046 (*)    Hgb urine dipstick SMALL (*)    Ketones, ur 80 (*)    All other components within normal limits  COMPREHENSIVE METABOLIC PANEL - Abnormal; Notable for the following components:   Glucose, Bld 105 (*)    Calcium 8.6 (*)    AST 12 (*)    Total Bilirubin 1.3 (*)    All other components within normal limits  URINE CULTURE  LIPASE, BLOOD  CBC WITH DIFFERENTIAL/PLATELET  CBC WITH DIFFERENTIAL/PLATELET  I-STAT BETA HCG BLOOD, ED ([facility], [facility], [facility] ONLY)    EKG EKG Interpretation  Date/Time:  Monday November 28 2018 15:32:20 EST Ventricular Rate:  60 PR Interval:    QRS Duration: 93 QT Interval:  409 QTC Calculation: 409 R Axis:   72 Text Interpretation:  Sinus rhythm Low voltage, precordial leads No previous ECGs available Confirmed by Alvira Monday ([id number]) on 11/28/2018 3:39:14 PM   Radiology Ct Abdomen Pelvis W Contrast  Result Date: 11/28/2018 CLINICAL DATA:  Lower abdominal pain, primarily right-sided with nausea and diarrhea EXAM: CT ABDOMEN AND PELVIS WITH CONTRAST TECHNIQUE: Multidetector CT imaging of the abdomen and pelvis was performed using the standard protocol following bolus administration of intravenous contrast. CONTRAST:  ISOVUE-300 IOPAMIDOL (ISOVUE-300) INJECTION 61% COMPARISON:  January 17, 2014 FINDINGS: Lower chest: Lung bases are clear. Hepatobiliary: There is fatty infiltration  near the fissure for the ligamentum teres. No focal liver lesions are evident. Gallbladder wall is not appreciably thickened. There is no biliary duct dilatation. Pancreas: No pancreatic mass or inflammatory focus. Spleen: No splenic lesions are evident. Adrenals/Urinary Tract: Adrenals bilaterally appear unremarkable. Kidneys bilaterally show no evident mass or hydronephrosis on either side. There is no appreciable renal or ureteral calculus on either side. Urinary bladder is midline with wall thickness within normal limits. Stomach/Bowel: There is extensive  inflammation involving the colon immediately proximal to and at the hepatic flexure with loss of wall detail and immediately adjacent soft tissue stranding. This lesion extends over a length of approximately 5 cm. There is no well-defined associated mass. There is no abscess or perforation associated with this lesion. No similar changes are noted elsewhere in the colon. There is no small bowel wall thickening. There is no evident bowel obstruction. There is no free air or portal venous air. Vascular/Lymphatic: There is no abdominal aortic aneurysm. No vascular lesions are demonstrable. There is no appreciable adenopathy in the abdomen or pelvis. Reproductive: Uterus is anteverted. No pelvic masses evident. There is a small amount of fluid in the dependent portion of the pelvis, slightly to the right of midline. Other: The appendix appears normal. There is no appreciable abscess in the abdomen or pelvis. There is no ascites beyond the slight fluid in the dependent portion of the pelvis. Musculoskeletal: No blastic or lytic bone lesions. There is no intramuscular or abdominal wall lesion elsewhere. IMPRESSION: 1. There is extensive apparent inflammation involving the wall of the distal ascending colon and hepatic flexure regions. There is loss of the detail of the wall in this area with adjacent mesenteric thickening. No diverticula are noted in this area. This  area is felt to represent localized colitis of uncertain etiology. Infectious colitis certainly is a differential consideration. An atypical neoplasm could present in this manner, although no well-defined mass is seen in this area. This area will warrant further evaluation with consideration for direct visualization after treatment for what appears to most likely represent infectious lesion and bowel cleansing. This area does not show evident abscess or perforation currently. 2. Elsewhere there is no bowel wall thickening. No bowel obstruction. No abscess in the abdomen pelvis. Appendix appears normal. 3. No renal or ureteral calculus.  No hydronephrosis. 4. Slight amount of fluid in the cul-de-sac could indicate recent ovarian cyst rupture or may be within the physiologic range. Electronically Signed   By: Bretta Bang III M.D.   On: 11/28/2018 12:59    Procedures Procedures (including critical care time)  Medications Ordered in ED Medications  sodium chloride 0.9 % bolus 1,000 mL (0 mLs Intravenous Stopped 11/28/18 1303)  morphine 4 MG/ML injection 4 mg (4 mg Intravenous Given 11/28/18 1058)  ondansetron (ZOFRAN) injection 4 mg (4 mg Intravenous Given 11/28/18 1058)  iopamidol (ISOVUE-300) 61 % injection 100 mL (100 mLs Intravenous Contrast Given 11/28/18 1234)  dicyclomine (BENTYL) injection 20 mg (20 mg Intramuscular Given 11/28/18 1527)     Initial Impression / Assessment and Plan / ED Course  I have reviewed the triage vital signs and the nursing notes.  Pertinent labs & imaging results that were available during my care of the patient were reviewed by me and considered in my medical decision making (see chart for details).        39 yo female presents with concern for right sided abdominal pain, chills, and nausea.  DDx includes appendicitis, cholecystitis, pyelonephritis, colitis.  Pregnancy test negative, no pelvic concerns, doubt TOA, PID, ectopic or torsion.  UA without  infection. CT abdomen pelvis shows area of colitis at right hepatic flexure without signs of abscess or perforation. No known risk factors for ischemic colitis, pt in normal sinus rhythm, and in setting of infectious symptoms such as chills, feel infectious etiology is most likely---however, recommend follow up with Gastroenterology.   Given rx for augmentin, zofran, bentyl. Rec ibuprofen/tylenol, and discussed risks of tramadol  in detail. Reviewed in [facility] database. Patient discharged in stable condition with understanding of reasons to return.   Final Clinical Impressions(s) / ED Diagnoses   Final diagnoses:  Colitis    ED Discharge Orders         Ordered    amoxicillin-clavulanate (AUGMENTIN) 875-125 MG tablet  Every 12 hours     11/28/18 1536    ondansetron (ZOFRAN ODT) 4 MG disintegrating tablet  Every 8 hours PRN     11/28/18 1536    traMADol (ULTRAM) 50 MG tablet  Every 6 hours PRN     11/28/18 1536    dicyclomine (BENTYL) 20 MG tablet  2 times daily     11/28/18 1536           Alvira MondaySchlossman, Bridget Nicut, MD 11/28/18 2011

## 2018-11-28 NOTE — ED Triage Notes (Signed)
Pt states RLQ pain x 3 days. Pt endorses nausea, but no emesis. Pt states on Saturday she had chills and woke up with hot sweat.

## 2018-11-28 NOTE — ED Notes (Signed)
PT IS IN X-RAY WILL GET VITALS ON RETURN

## 2018-11-30 LAB — URINE CULTURE: Culture: <10000 — AB

## 2019-06-28 ENCOUNTER — Other Ambulatory Visit: Payer: Self-pay

## 2019-06-28 DIAGNOSIS — Z20822 Contact with and (suspected) exposure to covid-19: Secondary | ICD-10-CM

## 2019-06-29 LAB — NOVEL CORONAVIRUS, NAA: SARS-CoV-2, NAA: NOT DETECTED

## 2019-07-03 ENCOUNTER — Telehealth: Payer: Self-pay | Admitting: General Practice

## 2019-07-03 NOTE — Telephone Encounter (Signed)
Pt aware covid lab test negative, not detected °

## 2019-07-26 DIAGNOSIS — M5442 Lumbago with sciatica, left side: Secondary | ICD-10-CM | POA: Diagnosis not present

## 2019-07-26 DIAGNOSIS — Z23 Encounter for immunization: Secondary | ICD-10-CM | POA: Diagnosis not present

## 2019-08-02 DIAGNOSIS — R600 Localized edema: Secondary | ICD-10-CM | POA: Diagnosis not present

## 2019-08-02 DIAGNOSIS — M79605 Pain in left leg: Secondary | ICD-10-CM | POA: Diagnosis not present

## 2019-08-03 DIAGNOSIS — R600 Localized edema: Secondary | ICD-10-CM | POA: Diagnosis not present

## 2019-08-03 DIAGNOSIS — Z87891 Personal history of nicotine dependence: Secondary | ICD-10-CM | POA: Diagnosis not present

## 2019-08-03 DIAGNOSIS — M79605 Pain in left leg: Secondary | ICD-10-CM | POA: Diagnosis not present

## 2019-08-03 DIAGNOSIS — M25562 Pain in left knee: Secondary | ICD-10-CM | POA: Diagnosis not present

## 2019-08-03 DIAGNOSIS — Z79899 Other long term (current) drug therapy: Secondary | ICD-10-CM | POA: Diagnosis not present

## 2020-04-09 ENCOUNTER — Telehealth: Payer: Self-pay | Admitting: General Practice

## 2020-04-09 NOTE — Telephone Encounter (Signed)
Ashley Ware, would like to know if you will accept her as a patient? Her husband is your pt Darolyn Double, MRN# [id number]. [name], is asking, if you accept her as a patient if she can be seen on 04/26/20? Please advise. Thanks

## 2020-04-09 NOTE — Telephone Encounter (Signed)
That is fine. She will have to come in the morning session for the establish care visit on 7/23

## 2020-04-10 NOTE — Telephone Encounter (Signed)
Spoke to pt and scheduled an appt on 04/26/20.

## 2020-04-26 ENCOUNTER — Encounter: Payer: Self-pay | Admitting: Adult Health

## 2020-04-26 ENCOUNTER — Other Ambulatory Visit: Payer: Self-pay

## 2020-04-26 ENCOUNTER — Ambulatory Visit (INDEPENDENT_AMBULATORY_CARE_PROVIDER_SITE_OTHER): Payer: No Typology Code available for payment source | Admitting: Adult Health

## 2020-04-26 VITALS — BP 104/72 | Temp 98.5°F | Wt 169.0 lb

## 2020-04-26 DIAGNOSIS — Z0000 Encounter for general adult medical examination without abnormal findings: Secondary | ICD-10-CM | POA: Diagnosis not present

## 2020-04-26 DIAGNOSIS — K5900 Constipation, unspecified: Secondary | ICD-10-CM

## 2020-04-26 NOTE — Patient Instructions (Addendum)
It was great meeting you today   We will follow up with you regarding your labs   Please Call Dr. Lendon Ka   Address: [address], [place], [place] [address]  [email or web address] Phone: [phone]

## 2020-04-26 NOTE — Progress Notes (Signed)
Patient presents to clinic today to establish care. She is a pleasant 39 year old female who  has a past medical history of No pertinent past medical history and Preterm labor.  She has not had any care  Acute Concerns: Establish Care   Chronic Issues: Constipation - has been a chronic issue. She has been working on changing her diet, cutting back on carbs and sugars as well as red meat.  She does drink plenty of water throughout the day.  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 No pertinent past medical history   . Preterm labor     Past Surgical History:  Procedure Laterality Date  . BREAST LUMPECTOMY    . CESAREAN SECTION     C/S x 1    Current Outpatient Medications on File Prior to Visit  Medication Sig Dispense Refill  . Probiotic Product (PROBIOTIC DAILY PO) Take by mouth.     No current facility-administered medications on file prior to visit.    No Known Allergies  Family History  Adopted: Yes  Problem Relation Age of Onset  . Anesthesia problems Neg Hx   . Hypotension Neg Hx   . Malignant hyperthermia Neg Hx   . Pseudochol deficiency Neg Hx     Social History   Socioeconomic History  . Marital status: Married    Spouse name: Not on file  . Number of children: Not on file  . Years of education: Not on file  . Highest education level: Not on file  Occupational History  . Not on file  Tobacco Use  . Smoking status: Former Smoker    Types: Cigarettes  . Smokeless tobacco: Never Used  Vaping Use  . Vaping Use: Every day  Substance and Sexual Activity  . Alcohol use: No  . Drug use: No  . Sexual activity: Yes    Birth  control/protection: None  Other Topics Concern  . Not on file  Social History Narrative  . Not on file   Social Determinants of Health   Financial Resource Strain:   . Difficulty of Paying Living Expenses:   Food Insecurity:   . Worried About Charity fundraiser in the Last Year:   . Arboriculturist in the Last Year:   Transportation Needs:   . Film/video editor (Medical):   Marland Kitchen Lack of Transportation (Non-Medical):   Physical Activity:   . Days of Exercise per Week:   . Minutes of Exercise per Session:   Stress:   . Feeling of Stress :   Social Connections:   . Frequency of Communication with Friends and Family:   . Frequency of Social Gatherings with Friends and Family:   . Attends Religious Services:   . Active Member of Clubs or Organizations:   . Attends Archivist Meetings:   Marland Kitchen Marital Status:   Intimate Partner Violence:   . Fear of Current or Ex-Partner:   . Emotionally Abused:   Marland Kitchen Physically Abused:   . Sexually Abused:     Review of Systems  Constitutional: Negative.   HENT: Negative.   Eyes:  Negative.   Respiratory: Negative.   Cardiovascular: Negative.   Gastrointestinal: Negative.   Genitourinary: Negative.   Musculoskeletal: Negative.   Skin: Negative.   Neurological: Negative.   Endo/Heme/Allergies: Negative.   Psychiatric/Behavioral: Negative.   All other systems reviewed and are negative.   BP 104/72   Temp 98.5 F (36.9 C)   Wt 169 lb (76.7 kg)   BMI 29.94 kg/m   Physical Exam Vitals and nursing note reviewed.  Constitutional:      General: She is not in acute distress.    Appearance: Normal appearance. She is well-developed and overweight. She is not ill-appearing.  HENT:     Head: Normocephalic and atraumatic.     Right Ear: Tympanic membrane, ear canal and external ear normal. There is no impacted cerumen.     Left Ear: Tympanic membrane, ear canal and external ear normal. There is no impacted cerumen.     Nose: Nose  normal. No congestion or rhinorrhea.     Mouth/Throat:     Mouth: Mucous membranes are moist.     Pharynx: Oropharynx is clear. No oropharyngeal exudate or posterior oropharyngeal erythema.  Eyes:     General:        Right eye: No discharge.        Left eye: No discharge.     Extraocular Movements: Extraocular movements intact.     Conjunctiva/sclera: Conjunctivae normal.     Pupils: Pupils are equal, round, and reactive to light.  Neck:     Thyroid: No thyromegaly.     Vascular: No carotid bruit.     Trachea: No tracheal deviation.  Cardiovascular:     Rate and Rhythm: Normal rate and regular rhythm.     Pulses: Normal pulses.     Heart sounds: Normal heart sounds. No murmur heard.  No friction rub. No gallop.   Pulmonary:     Effort: Pulmonary effort is normal. No respiratory distress.     Breath sounds: Normal breath sounds. No stridor. No wheezing, rhonchi or rales.  Chest:     Chest wall: No tenderness.  Abdominal:     General: Abdomen is flat. Bowel sounds are normal. There is no distension.     Palpations: Abdomen is soft. There is no mass.     Tenderness: There is no abdominal tenderness. There is no right CVA tenderness, left CVA tenderness, guarding or rebound.     Hernia: No hernia is present.  Musculoskeletal:        General: No swelling, tenderness, deformity or signs of injury. Normal range of motion.     Cervical back: Normal range of motion and neck supple.     Right lower leg: No edema.     Left lower leg: No edema.  Lymphadenopathy:     Cervical: No cervical adenopathy.  Skin:    General: Skin is warm and dry.     Coloration: Skin is not jaundiced or pale.     Findings: No bruising, erythema, lesion or rash.  Neurological:     General: No focal deficit present.     Mental Status: She is alert and oriented to person, place, and time.     Cranial Nerves: No cranial nerve deficit.     Sensory: No sensory deficit.     Motor: No weakness.     Coordination:  Coordination normal.     Gait: Gait normal.     Deep Tendon Reflexes: Reflexes normal.  Psychiatric:  Mood and Affect: Mood normal.        Behavior: Behavior normal.        Thought Content: Thought content normal.        Judgment: Judgment normal.     Assessment/Plan: 1. Routine general medical examination at a health care facility - Encouraged heart healthy diet and aerobic exercise  - Follow up in one year or sooner if needed - CBC with Differential/Platelet; Future - Hemoglobin A1c; Future - Lipid panel; Future - TSH; Future - CMP with eGFR([facility]); Future - CMP with eGFR([facility]) - TSH - Lipid panel - Hemoglobin A1c - CBC with Differential/Platelet  2. Constipation, unspecified constipation type - Encouraged exercise and can add Benifber daily.    Dorothyann Peng, NP

## 2020-04-27 LAB — HEMOGLOBIN A1C
Hgb A1c MFr Bld: 5.3 % of total Hgb (ref <5.7)
Mean Plasma Glucose: 105 (calc)
eAG (mmol/L): 5.8 (calc)

## 2020-04-27 LAB — CBC WITH DIFFERENTIAL/PLATELET
Absolute Monocytes: 399 cells/uL (ref 200–950)
Basophils Absolute: 29 cells/uL (ref 0–200)
Basophils Relative: 0.5 %
Eosinophils Absolute: 51 cells/uL (ref 15–500)
Eosinophils Relative: 0.9 %
HCT: 39.8 % (ref 35.0–45.0)
Hemoglobin: 13 g/dL (ref 11.7–15.5)
Lymphs Abs: 1853 cells/uL (ref 850–3900)
MCH: 30.3 pg (ref 27.0–33.0)
MCHC: 32.7 g/dL (ref 32.0–36.0)
MCV: 92.8 fL (ref 80.0–100.0)
MPV: 9.6 fL (ref 7.5–12.5)
Monocytes Relative: 7 %
Neutro Abs: 3369 cells/uL (ref 1500–7800)
Neutrophils Relative %: 59.1 %
Platelets: 245 10*3/uL (ref 140–400)
RBC: 4.29 10*6/uL (ref 3.80–5.10)
RDW: 12.3 % (ref 11.0–15.0)
Total Lymphocyte: 32.5 %
WBC: 5.7 10*3/uL (ref 3.8–10.8)

## 2020-04-27 LAB — COMPLETE METABOLIC PANEL WITH GFR
AG Ratio: 1.7 (calc) (ref 1.0–2.5)
ALT: 7 U/L (ref 6–29)
AST: 14 U/L (ref 10–30)
Albumin: 4.3 g/dL (ref 3.6–5.1)
Alkaline phosphatase (APISO): 41 U/L (ref 31–125)
BUN: 19 mg/dL (ref 7–25)
CO2: 23 mmol/L (ref 20–32)
Calcium: 9.3 mg/dL (ref 8.6–10.2)
Chloride: 105 mmol/L (ref 98–110)
Creat: 0.78 mg/dL (ref 0.50–1.10)
GFR, Est African American: 110 mL/min/{1.73_m2} (ref >=60)
GFR, Est Non African American: 95 mL/min/{1.73_m2} (ref >=60)
Globulin: 2.6 g/dL (calc) (ref 1.9–3.7)
Glucose, Bld: 95 mg/dL (ref 65–99)
Potassium: 4.1 mmol/L (ref 3.5–5.3)
Sodium: 137 mmol/L (ref 135–146)
Total Bilirubin: 0.5 mg/dL (ref 0.2–1.2)
Total Protein: 6.9 g/dL (ref 6.1–8.1)

## 2020-04-27 LAB — LIPID PANEL
Cholesterol: 183 mg/dL (ref <200)
HDL: 77 mg/dL (ref >=50)
LDL Cholesterol (Calc): 89 mg/dL (calc)
Non-HDL Cholesterol (Calc): 106 mg/dL (calc) (ref <130)
Total CHOL/HDL Ratio: 2.4 (calc) (ref <5.0)
Triglycerides: 80 mg/dL (ref <150)

## 2020-04-27 LAB — TSH: TSH: 2.32 mIU/L

## 2020-07-15 DIAGNOSIS — M545 Low back pain, unspecified: Secondary | ICD-10-CM | POA: Diagnosis not present

## 2020-07-15 DIAGNOSIS — M5416 Radiculopathy, lumbar region: Secondary | ICD-10-CM | POA: Diagnosis not present

## 2020-07-16 ENCOUNTER — Other Ambulatory Visit: Payer: Self-pay | Admitting: Orthopedic Surgery

## 2020-07-16 DIAGNOSIS — M545 Low back pain, unspecified: Secondary | ICD-10-CM

## 2020-08-09 ENCOUNTER — Ambulatory Visit
Admission: RE | Admit: 2020-08-09 | Discharge: 2020-08-09 | Disposition: A | Discharge status: Home / Self Care | Payer: BC Managed Care – PPO | Source: Ambulatory Visit | Attending: Orthopedic Surgery | Admitting: Orthopedic Surgery

## 2020-08-09 DIAGNOSIS — M545 Low back pain, unspecified: Secondary | ICD-10-CM

## 2020-08-19 DIAGNOSIS — M545 Low back pain, unspecified: Secondary | ICD-10-CM | POA: Diagnosis not present

## 2020-09-18 DIAGNOSIS — M5416 Radiculopathy, lumbar region: Secondary | ICD-10-CM | POA: Diagnosis not present

## 2020-09-18 DIAGNOSIS — S39012D Strain of muscle, fascia and tendon of lower back, subsequent encounter: Secondary | ICD-10-CM | POA: Diagnosis not present

## 2020-12-22 ENCOUNTER — Encounter: Payer: Self-pay | Admitting: *Deleted

## 2020-12-22 ENCOUNTER — Ambulatory Visit (INDEPENDENT_AMBULATORY_CARE_PROVIDER_SITE_OTHER): Payer: No Typology Code available for payment source

## 2020-12-22 ENCOUNTER — Other Ambulatory Visit: Payer: Self-pay

## 2020-12-22 ENCOUNTER — Ambulatory Visit
Admission: EM | Admit: 2020-12-22 | Discharge: 2020-12-22 | Disposition: A | Discharge status: Home / Self Care | Payer: No Typology Code available for payment source | Attending: Emergency Medicine | Admitting: Emergency Medicine

## 2020-12-22 DIAGNOSIS — M79672 Pain in left foot: Secondary | ICD-10-CM

## 2020-12-22 MED ORDER — PREDNISONE 10 MG PO TABS: ORAL_TABLET | ORAL | 0 refills | Status: DC

## 2020-12-22 NOTE — Discharge Instructions (Signed)
Please continue warm soaks/compresses Elevate Begin prednisone course to help with swelling/inflammation Follow-up with sports medicine this week

## 2020-12-22 NOTE — ED Triage Notes (Signed)
C/O left foot pain onset 2 days ago while at work.  C/O painful walking with "knot" on bottom of foot.  Has tried soaking, ice, massage

## 2020-12-22 NOTE — ED Provider Notes (Signed)
[facility]    CSN: [id number] Arrival date & time: 12/22/20  1154      History   Chief Complaint Chief Complaint  Patient presents with  . Foot Pain    HPI Ashley Ware is a 40 y.o. female presenting today for evaluation of left foot pain.  Reports symptoms began 2 days ago while at work.  Reports painful knot on bottom of foot.  Tried soaking ice and massage without relief.  Denies history of similar.  Denies any possibility of foreign body.  HPI  Past Medical History:  Diagnosis Date  . Preterm labor     There are no problems to display for this patient.   Past Surgical History:  Procedure Laterality Date  . BREAST LUMPECTOMY    . CESAREAN SECTION     C/S x 1    OB History    Gravida  8   Para  4   Term  2   Preterm  2   AB  4   Living  4     SAB  4   IAB  0   Ectopic  0   Multiple  0   Live Births  2            Home Medications    Prior to Admission medications   Medication Sig Start Date End Date Taking? Authorizing Provider  predniSONE (DELTASONE) 10 MG tablet Begin with 6 tabs on day 1, 5 tab on day 2, 4 tab on day 3, 3 tab on day 4, 2 tab on day 5, 1 tab on day 6-take with food 12/22/20  Yes Crescentia Boutwell, [place] C, PA-C    Family History Family History  Adopted: Yes  Problem Relation Age of Onset  . Anesthesia problems Neg Hx   . Hypotension Neg Hx   . Malignant hyperthermia Neg Hx   . Pseudochol deficiency Neg Hx     Social History Social History   Tobacco Use  . Smoking status: Former Smoker    Types: Cigarettes  . Smokeless tobacco: Never Used  Vaping Use  . Vaping Use: Every day  Substance Use Topics  . Alcohol use: No  . Drug use: No     Allergies   Patient has no known allergies.   Review of Systems Review of Systems  Constitutional: Negative for fatigue and fever.  Eyes: Negative for visual disturbance.  Respiratory: Negative for shortness of breath.   Cardiovascular: Negative for  chest pain.  Gastrointestinal: Negative for abdominal pain, nausea and vomiting.  Musculoskeletal: Positive for gait problem. Negative for arthralgias and joint swelling.  Skin: Negative for color change, rash and wound.  Neurological: Negative for dizziness, weakness, light-headedness and headaches.     Physical Exam Triage Vital Signs ED Triage Vitals  Enc Vitals Group     BP 12/22/20 1202 122/77     Pulse Rate 12/22/20 1202 90     Resp 12/22/20 1202 16     Temp 12/22/20 1202 98 F (36.7 C)     Temp src --      SpO2 12/22/20 1202 99 %     Weight --      Height --      Head Circumference --      Peak Flow --      Pain Score 12/22/20 1201 8     Pain Loc --      Pain Edu? --      Excl. in GC? --  No data found.  Updated Vital Signs BP 122/77   Pulse 90   Temp 98 F (36.7 C)   Resp 16   LMP 12/09/2020 (Approximate)   SpO2 99%   Visual Acuity Right Eye Distance:   Left Eye Distance:   Bilateral Distance:    Right Eye Near:   Left Eye Near:    Bilateral Near:     Physical Exam Vitals and nursing note reviewed.  Constitutional:      Appearance: She is well-developed.     Comments: No acute distress  HENT:     Head: Normocephalic and atraumatic.     Nose: Nose normal.  Eyes:     Conjunctiva/sclera: Conjunctivae normal.  Cardiovascular:     Rate and Rhythm: Normal rate.  Pulmonary:     Effort: Pulmonary effort is normal. No respiratory distress.  Abdominal:     General: There is no distension.  Musculoskeletal:        General: Normal range of motion.     Cervical back: Neck supple.     Comments: Left foot: Plantar surface of foot with palpable 0.5 cm ball, slightly mobile, tender to light touch, no overlying erythema or skin changes  Skin:    General: Skin is warm and dry.  Neurological:     Mental Status: She is alert and oriented to person, place, and time.      UC Treatments / Results  Labs (all labs ordered are listed, but only abnormal  results are displayed) Labs Reviewed - No data to display  EKG   Radiology DG Foot Complete Left  Result Date: 12/22/2020 CLINICAL DATA:  Left foot pain onset 2 days ago, no known injury. Pt sts feels like "knot" on bottom of foot. Knot is on planter surface of foot at medial aspect of 2nd metatarsal. EXAM: LEFT FOOT - COMPLETE 3+ VIEW COMPARISON:  Left foot radiographs 01/17/2018 FINDINGS: There is no evidence of fracture or dislocation. There is no evidence of arthropathy or other focal bone abnormality. Soft tissues are unremarkable. IMPRESSION: Negative. Electronically Signed   By: Emmaline Kluver M.D.   On: 12/22/2020 12:33    Procedures Procedures (including critical care time)  Medications Ordered in UC Medications - No data to display  Initial Impression / Assessment and Plan / UC Course  I have reviewed the triage vital signs and the nursing notes.  Pertinent labs & imaging results that were available during my care of the patient were reviewed by me and considered in my medical decision making (see chart for details).     X-ray unremarkable, no signs of soft tissue abnormality, does not appear suggestive of wart, callus or corn, no signs of infection, no erythema, possible underlying cyst, given size deferring any type of aspiration.  Encouraged follow-up with sports medicine for further evaluation, possible ultrasounding.  Trial of prednisone and Ace wrap in the interim  Discussed strict return precautions. Patient verbalized understanding and is agreeable with plan.  Final Clinical Impressions(s) / UC Diagnoses   Final diagnoses:  Left foot pain     Discharge Instructions     Please continue warm soaks/compresses Elevate Begin prednisone course to help with swelling/inflammation Follow-up with sports medicine this week     ED Prescriptions    Medication Sig Dispense Auth. Provider   predniSONE (DELTASONE) 10 MG tablet Begin with 6 tabs on day 1, 5 tab on  day 2, 4 tab on day 3, 3 tab on day 4, 2 tab on  day 5, 1 tab on day 6-take with food 21 tablet Aylssa Herrig, [place] C, PA-C     PDMP not reviewed this encounter.   Lew Dawes, [place] 12/22/20 1242

## 2022-03-12 IMAGING — MR MR LUMBAR SPINE W/O CM
4 of 5 series · 18 of 48 positions shown · non-contrast
Comparison: None available.

CLINICAL DATA: Initial evaluation for low back pain since injury in
Tuesday May, 2020.

EXAM:
MRI LUMBAR SPINE WITHOUT CONTRAST
TECHNIQUE: Multiplanar, multisequence MR imaging of the lumbar spine was
performed. No intravenous contrast was administered.

[Series 6: T2 · sagittal · 4.0mm · 0.73mm/px · 6 of 15 slices shown (1 of 2)]
[im 1/15]
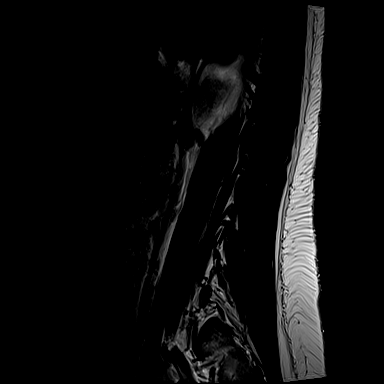
[im 3/15]
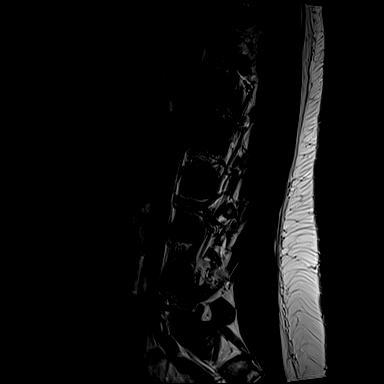
[im 6/15]
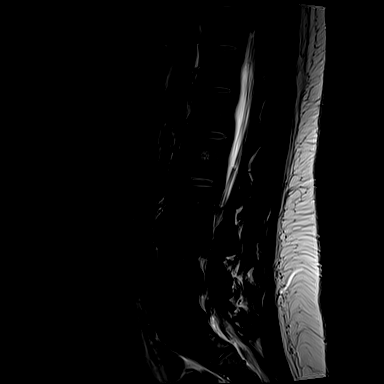
[im 9/15]
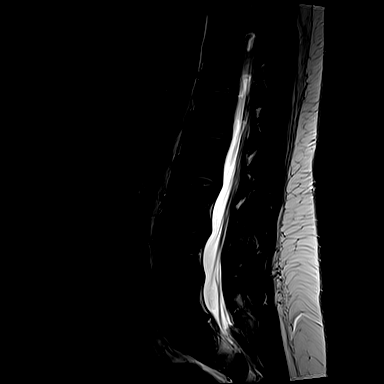
[im 12/15]
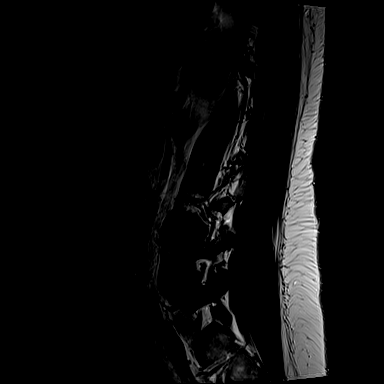
[im 15/15]
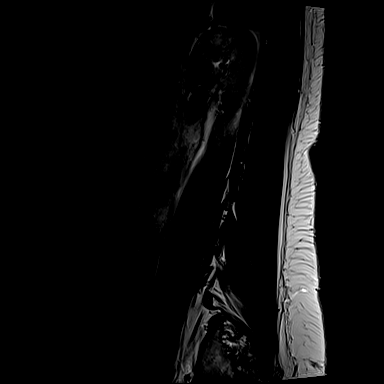

[Series 7: T1 · sagittal · 4.0mm · 0.73mm/px · 3 of 15 slices shown (1 of 2)]
[im 3/15]
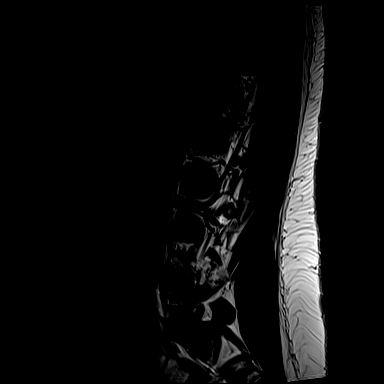
[im 9/15]
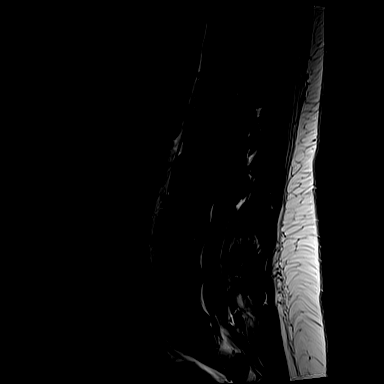
[im 15/15]
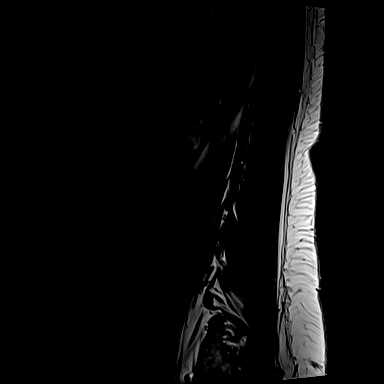

[Series 11: T1 · axial · 4.0mm · 0.28mm/px · z∈[-94,+67]mm · 3 of 40 slices shown (2 of 2)]
[im 6/40]
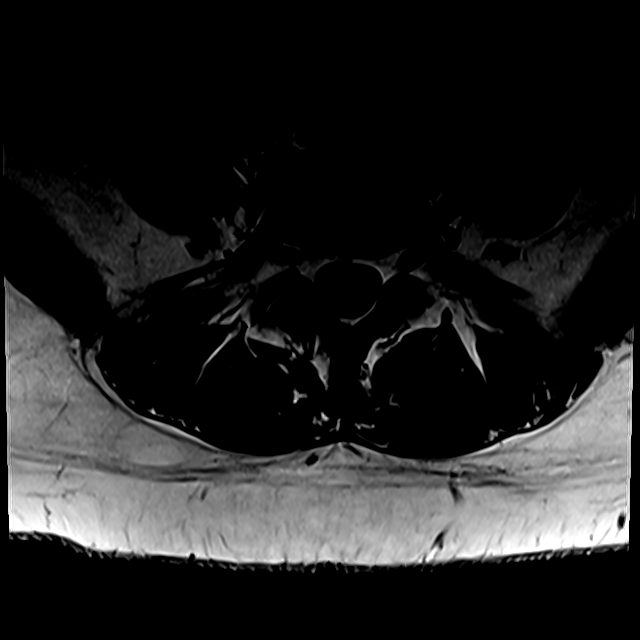
[im 20/40]
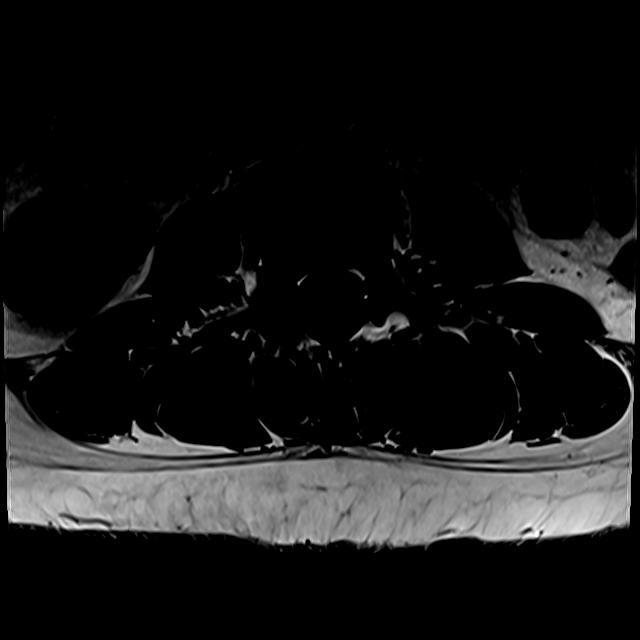
[im 34/40]
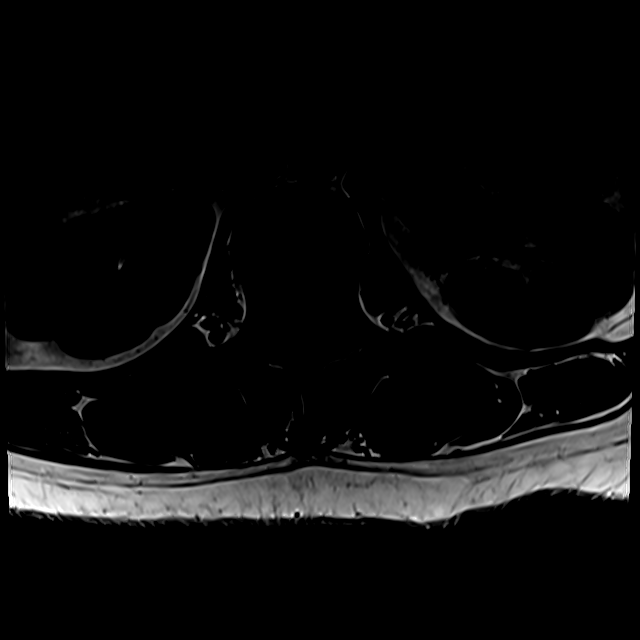

[Series 14: T2 · axial · 4.0mm · 0.28mm/px · z∈[-119,+67]mm · 6 of 40 slices shown (2 of 2)]
[im 1/40]
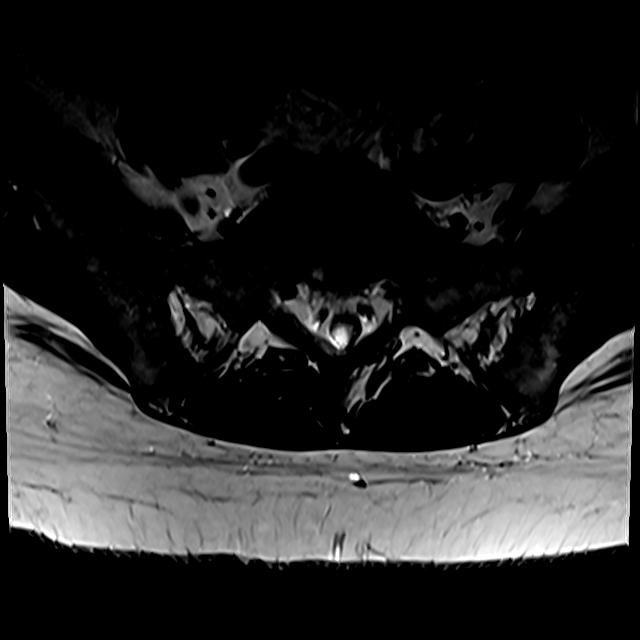
[im 6/40]
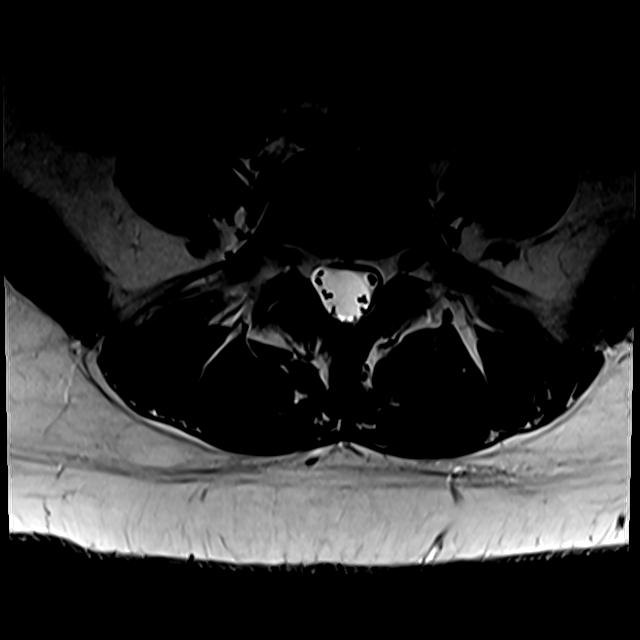
[im 12/40]
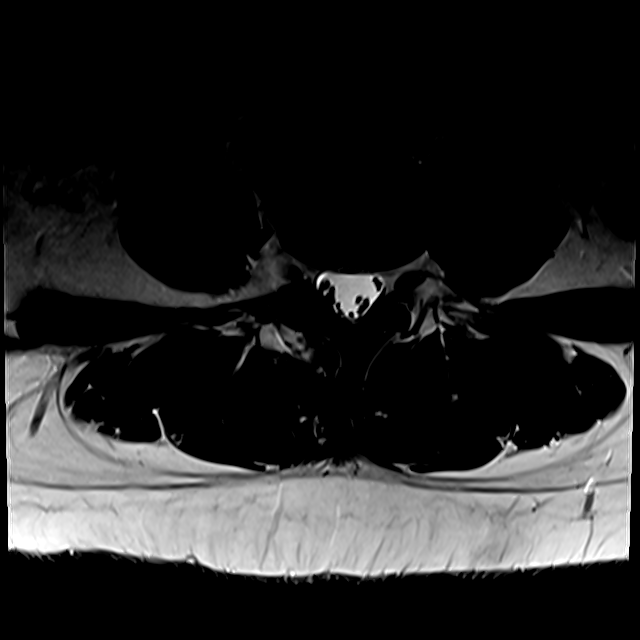
[im 17/40]
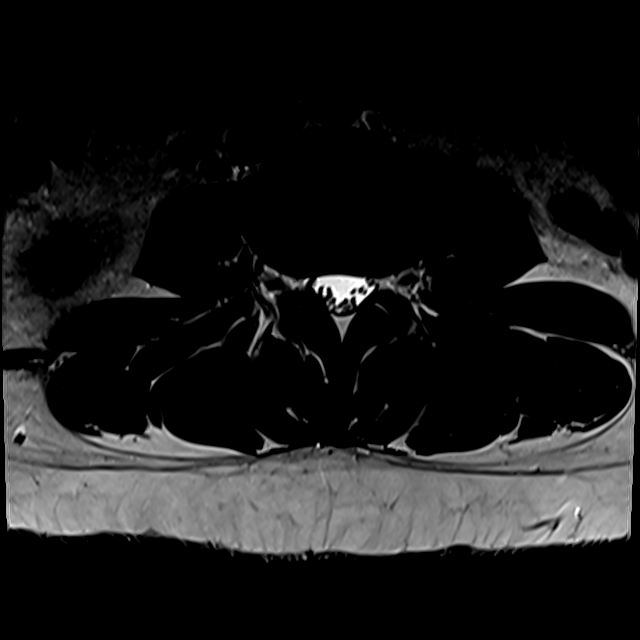
[im 20/40]
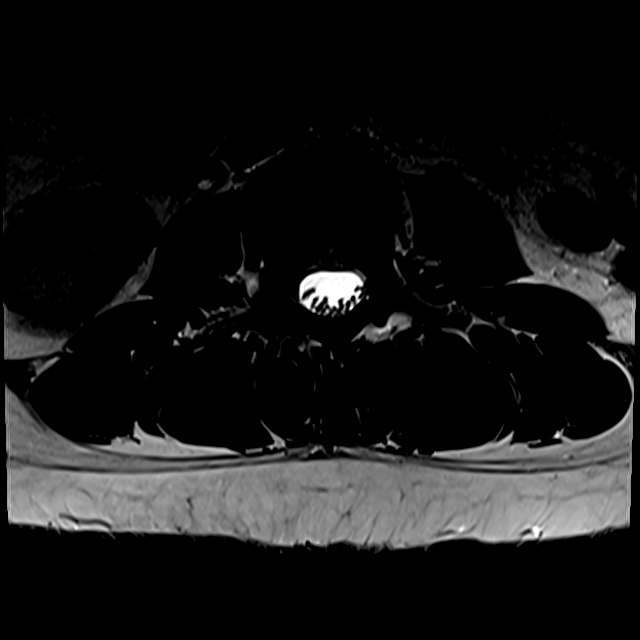
[im 34/40]
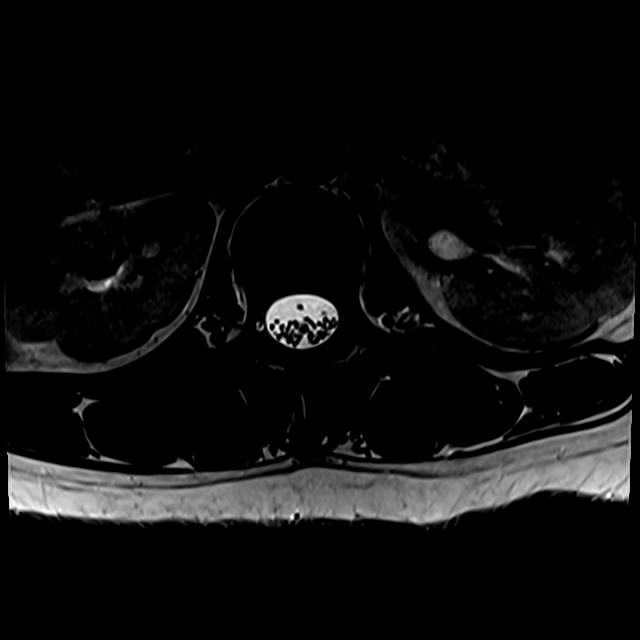

[18 of 48 positions shown; findings below may reference images not displayed]

FINDINGS: Segmentation: Standard. Lowest well-formed disc space labeled the
L5-S1 level.

Alignment: Physiologic with preservation of the normal lumbar
lordosis. No listhesis.

Vertebrae: Vertebral body height maintained without acute or chronic
fracture. Small chronic endplate Schmorl's node deformity noted at
the superior endplate of L3. Bone marrow signal intensity normal.
Few scattered benign hemangiomata noted. No worrisome osseous
lesions. No abnormal marrow edema.

Conus medullaris and cauda equina: Conus extends to the L1 level.
Conus and cauda equina appear normal.

Paraspinal and other soft tissues: Unremarkable.

Disc levels:

L1-2:  Unremarkable.

L2-3: Chronic endplate Schmorl's node deformity at the superior
endplate of L3. Otherwise negative.

L3-4: Circumferential disc bulge with disc desiccation. No spinal
stenosis. Foramina remain patent.

L4-5: Mild circumferential disc bulge, slightly eccentric to the
left. Mild facet and ligament flavum hypertrophy. No significant
spinal stenosis. Foramina remain patent.

L5-S1: Shallow left subarticular disc protrusion with annular
fissure (series 14, image 37). Protruding disc contacts the
descending left S1 nerve root without frank impingement or
displacement. No significant canal or lateral recess stenosis.
Foramina remain patent.
IMPRESSION: 1. Shallow left subarticular disc protrusion with annular fissure at
L5-S1, contacting and potentially irritating the descending left S1
nerve root.
2. Mild noncompressive disc bulging at L3-4 and L4-5 without
significant stenosis or neural impingement.

## 2023-02-09 ENCOUNTER — Telehealth: Payer: Self-pay | Admitting: Adult Health

## 2023-02-09 NOTE — Telephone Encounter (Signed)
Pt is requesting a Transfer of Care -  From:   C. Nafziger, NP @ [facility]  To:        B. Casimiro Needle, MD @ [facility]  Are both providers okay with this request?  Please advise.  Respectfully,  Isa C.

## 2023-02-09 NOTE — Telephone Encounter (Signed)
Okay to transfer  

## 2023-02-10 NOTE — Telephone Encounter (Signed)
Ok with me if ok with Cory. 

## 2023-02-10 NOTE — Telephone Encounter (Signed)
Noted  

## 2023-02-10 NOTE — Telephone Encounter (Signed)
Pt scheduled for Thursday, 02/25/23.

## 2023-02-25 ENCOUNTER — Ambulatory Visit: Payer: No Typology Code available for payment source | Admitting: Family Medicine

## 2023-02-25 ENCOUNTER — Encounter: Payer: Self-pay | Admitting: Family Medicine

## 2023-02-25 DIAGNOSIS — E782 Mixed hyperlipidemia: Secondary | ICD-10-CM

## 2023-02-25 DIAGNOSIS — R030 Elevated blood-pressure reading, without diagnosis of hypertension: Secondary | ICD-10-CM | POA: Diagnosis not present

## 2023-02-25 DIAGNOSIS — Z6837 Body mass index (BMI) 37.0-37.9, adult: Secondary | ICD-10-CM | POA: Diagnosis not present

## 2023-02-25 MED ORDER — ZEPBOUND 5 MG/0.5ML ~~LOC~~ SOAJ: 5.0000 mg | SUBCUTANEOUS | 0 refills | Status: DC

## 2023-02-25 MED ORDER — ZEPBOUND 2.5 MG/0.5ML ~~LOC~~ SOAJ: 2.5000 mg | SUBCUTANEOUS | 0 refills | Status: DC

## 2023-02-25 MED ORDER — ZEPBOUND 7.5 MG/0.5ML ~~LOC~~ SOAJ: 7.5000 mg | SUBCUTANEOUS | 0 refills | Status: DC

## 2023-02-25 NOTE — Patient Instructions (Addendum)
Total calories: 1800 calories   Total protein: 90-100 grams per day  Total fiber: 35 gram per day  Total carbs: less than 40-50 grams per day  Prenatal vitamin daily Florastor-- probiotic  Psyllium husk fiber or metamucil packets

## 2023-02-25 NOTE — Assessment & Plan Note (Signed)
I have had an extensive 30 minute conversation today with the patient about healthy eating habits, exercise, calorie and carb goals for sustainable and successful weight loss. I gave the patient caloric and protein daily intake values as well as described the importance of increasing fiber and water intake. I discussed weight loss medications that could be used in the treatment of this patient. Handouts on low carb eating were given to the patient.  The calorie and carb restriction I gave the patient is listed below. I will prescribe Zepbound and titrate up as tolerated. The patient has met the requirement of 1 year of trying diet and exercise to lose weight and she has also tried phentermine but had side effects to the medication (palpitations and headaches). Comorbid conditions include irregular periods and hyperlipidemia. She will continue to see me every 3 months for refills and weight checks.  Total calories: 1800 calories   Total protein: 90-100 grams per day  Total fiber: 35 gram per day  Total carbs: less than 40-50 grams per day

## 2023-02-25 NOTE — Assessment & Plan Note (Signed)
Also a comorbid condition to her obesity.

## 2023-02-25 NOTE — Progress Notes (Signed)
Established Patient Office Visit  Subjective   Patient ID: Ashley Ware, female    DOB: 1981/05/08  Age: 42 y.o. MRN: [id number]  Chief Complaint  Patient presents with   Establish Care    Pt is here for [facility] visit.   Patient does not have any current medical issues or symptoms. She reports a history of ruptured ovarian cysts in the past in 2015, has not had any trouble since that time.   Patient is concerned about her weight, states that she really needs to lose weight, was going to schedule a consult at [facility] for weight loss treatment. States that she knows her cholesterol is high, states she just recently had blood work at [facility] and she was told that she also had a vitamin D deficiency. In the last year patient has ben dieting and exercising -- doing 30 minutes-1 hour of exercise daily. Has tried [employer], intermittent fasting, she even strictly reduced calories to just 1 meal per day. States that she has not had any success, she even was on phentermine 1 year ago but had side effects (headaches and high heart rate).   Comorbid conditions include hyperlipidemia, irregular periods.    She also thinks that she is having some premenopause. States that she has been having changes in her periods, states they are a little irregular, they are extremely heavy and she has very heavy bleeding. States her previous GYN retired.     Current Outpatient Medications  Medication Instructions   Zepbound 5 mg, Subcutaneous, Weekly   Zepbound 7.5 mg, Subcutaneous, Weekly   Zepbound 2.5 mg, Subcutaneous, Weekly, Body mass index is 37.82 kg/m .    Patient Active Problem List   Diagnosis Date Noted   Class 2 severe obesity due to excess calories with serious comorbidity and body mass index (BMI) of 37.0 to 37.9 in adult [facility]) 02/25/2023   Mixed hyperlipidemia 02/25/2023   Prehypertension 02/25/2023      Review of Systems  All other systems reviewed and are negative.      Objective:     BP (!) 120/90 (BP Location: Left Arm, Patient Position: Sitting, Cuff Size: Large)   Pulse 74   Temp 98.8 F (37.1 C) (Oral)   Ht 5\' 3"  (1.6 m)   Wt 213 lb 8 oz (96.8 kg)   LMP 01/28/2023 (Exact Date)   SpO2 98%   BMI 37.82 kg/m    Physical Exam Vitals reviewed.  Constitutional:      Appearance: Normal appearance. She is well-groomed. She is obese.  Eyes:     Conjunctiva/sclera: Conjunctivae normal.  Neck:     Thyroid: No thyromegaly.  Cardiovascular:     Rate and Rhythm: Normal rate and regular rhythm.     Pulses: Normal pulses.     Heart sounds: S1 normal and S2 normal.  Pulmonary:     Effort: Pulmonary effort is normal.     Breath sounds: Normal breath sounds and air entry.  Abdominal:     General: Bowel sounds are normal.  Musculoskeletal:     Right lower leg: No edema.     Left lower leg: No edema.  Neurological:     Mental Status: She is alert and oriented to person, place, and time. Mental status is at baseline.     Gait: Gait is intact.  Psychiatric:        Mood and Affect: Mood and affect normal.        Speech: Speech normal.  Behavior: Behavior normal.        Judgment: Judgment normal.      No results found for any visits on 02/25/23.    The 10-year ASCVD risk score (Arnett DK, et al., 2019) is: 0.3%    Assessment & Plan:  Class 2 severe obesity due to excess calories with serious comorbidity and body mass index (BMI) of 37.0 to 37.9 in adult [facility]) Assessment & Plan: I have had an extensive 30 minute conversation today with the patient about healthy eating habits, exercise, calorie and carb goals for sustainable and successful weight loss. I gave the patient caloric and protein daily intake values as well as described the importance of increasing fiber and water intake. I discussed weight loss medications that could be used in the treatment of this patient. Handouts on low carb eating were given to the patient.  The calorie and  carb restriction I gave the patient is listed below. I will prescribe Zepbound and titrate up as tolerated. The patient has met the requirement of 1 year of trying diet and exercise to lose weight and she has also tried phentermine but had side effects to the medication (palpitations and headaches). Comorbid conditions include irregular periods and hyperlipidemia. She will continue to see me every 3 months for refills and weight checks.  Total calories: 1800 calories   Total protein: 90-100 grams per day  Total fiber: 35 gram per day  Total carbs: less than 40-50 grams per day  Orders: -     Zepbound; Inject 5 mg into the skin once a week.  Dispense: 2 mL; Refill: 0 -     Zepbound; Inject 7.5 mg into the skin once a week.  Dispense: 2 mL; Refill: 0  Mixed hyperlipidemia  Prehypertension Assessment & Plan: Also a comorbid condition to her obesity.     I spent 35 minutes with patient today giving counseling and discussing medications.   Return in about 3 months (around 05/28/2023).    Karie Georges, MD

## 2023-02-26 ENCOUNTER — Encounter: Payer: Self-pay | Admitting: Family Medicine

## 2023-03-01 ENCOUNTER — Encounter: Payer: Self-pay | Admitting: Family Medicine

## 2023-03-01 DIAGNOSIS — R110 Nausea: Secondary | ICD-10-CM

## 2023-03-03 ENCOUNTER — Encounter: Payer: Self-pay | Admitting: Family Medicine

## 2023-03-04 MED ORDER — ONDANSETRON HCL 4 MG PO TABS: 4.0000 mg | ORAL_TABLET | Freq: Three times a day (TID) | ORAL | 0 refills | Status: AC | PRN

## 2023-03-05 ENCOUNTER — Encounter: Payer: Self-pay | Admitting: Family Medicine

## 2023-03-05 DIAGNOSIS — E559 Vitamin D deficiency, unspecified: Secondary | ICD-10-CM

## 2023-03-11 MED ORDER — VITAMIN D (ERGOCALCIFEROL) 1.25 MG (50000 UNIT) PO CAPS: 50000.0000 [IU] | ORAL_CAPSULE | ORAL | 1 refills | Status: DC

## 2023-03-23 ENCOUNTER — Encounter: Payer: Self-pay | Admitting: Family Medicine

## 2023-04-16 ENCOUNTER — Encounter: Payer: Self-pay | Admitting: Family Medicine

## 2023-04-16 ENCOUNTER — Other Ambulatory Visit: Payer: Self-pay | Admitting: Family Medicine

## 2023-04-16 MED ORDER — ZEPBOUND 10 MG/0.5ML ~~LOC~~ SOAJ: 10.0000 mg | SUBCUTANEOUS | 0 refills | Status: DC

## 2023-04-30 ENCOUNTER — Encounter: Payer: Self-pay | Admitting: Family Medicine

## 2023-04-30 ENCOUNTER — Other Ambulatory Visit: Payer: Self-pay | Admitting: Family Medicine

## 2023-04-30 MED ORDER — ZEPBOUND 7.5 MG/0.5ML ~~LOC~~ SOAJ: 7.5000 mg | SUBCUTANEOUS | 0 refills | Status: DC

## 2023-05-05 ENCOUNTER — Encounter (INDEPENDENT_AMBULATORY_CARE_PROVIDER_SITE_OTHER): Payer: Self-pay

## 2023-05-28 ENCOUNTER — Ambulatory Visit: Payer: No Typology Code available for payment source | Admitting: Family Medicine

## 2023-05-28 VITALS — BP 108/76 | HR 93 | Temp 98.3°F | Wt 195.0 lb

## 2023-05-28 DIAGNOSIS — Z6837 Body mass index (BMI) 37.0-37.9, adult: Secondary | ICD-10-CM

## 2023-05-28 DIAGNOSIS — R12 Heartburn: Secondary | ICD-10-CM

## 2023-05-28 MED ORDER — ZEPBOUND 15 MG/0.5ML ~~LOC~~ SOAJ: 15.0000 mg | SUBCUTANEOUS | 5 refills | Status: DC

## 2023-05-28 MED ORDER — ZEPBOUND 12.5 MG/0.5ML ~~LOC~~ SOAJ: 12.5000 mg | SUBCUTANEOUS | 0 refills | Status: DC

## 2023-05-28 MED ORDER — FAMOTIDINE 40 MG PO TABS: 40.0000 mg | ORAL_TABLET | Freq: Every day | ORAL | 0 refills | Status: DC

## 2023-05-28 NOTE — Progress Notes (Signed)
Established Patient Office Visit  Subjective   Patient ID: Ashley Ware, female    DOB: 1980/12/01  Age: 42 y.o. MRN: [id number]  Chief Complaint  Patient presents with   Follow-up    Weight management    Patient is here for weight loss follow up. Pt is having increased heartburn, especially at night. Also she is having increasing difficulty with constipation. States she just recently started probiotics. Counseled patient on a good bowel regimen. She is on 7.5 mg weekly since they did not have the 10 mg in stock. States that she just picked up the 10 mg dose.   Pt reports she is starting shed more hair. We discussed that this is a result of the weight loss and that once she reaches her goal weight her hair should partially grow back.     Current Outpatient Medications  Medication Instructions   famotidine (PEPCID) 40 mg, Oral, Daily   ondansetron (ZOFRAN) 4 mg, Oral, Every 8 hours PRN   Probiotic Product (PROBIOTIC DAILY PO) Oral, Daily   Vitamin D (Ergocalciferol) (DRISDOL) 50,000 Units, Oral, Every 7 days   Zepbound 10 mg, Subcutaneous, Weekly   Zepbound 12.5 mg, Subcutaneous, Weekly   Zepbound 15 mg, Subcutaneous, Weekly    Patient Active Problem List   Diagnosis Date Noted   Class 2 severe obesity due to excess calories with serious comorbidity and body mass index (BMI) of 37.0 to 37.9 in adult [facility]) 02/25/2023   Mixed hyperlipidemia 02/25/2023   Prehypertension 02/25/2023      Review of Systems  All other systems reviewed and are negative.     Objective:     BP 108/76 (BP Location: Left Arm, Patient Position: Sitting, Cuff Size: Large)   Pulse 93   Temp 98.3 F (36.8 C) (Oral)   Wt 195 lb (88.5 kg)   SpO2 99%   BMI 34.54 kg/m    Physical Exam Vitals reviewed.  Constitutional:      Appearance: Normal appearance. She is obese.  Eyes:     Conjunctiva/sclera: Conjunctivae normal.  Pulmonary:     Effort: Pulmonary effort is normal.  Neurological:      Mental Status: She is alert and oriented to person, place, and time. Mental status is at baseline.  Psychiatric:        Mood and Affect: Mood normal.        Behavior: Behavior normal.      No results found for any visits on 05/28/23.    The ASCVD Risk score (Arnett DK, et al., 2019) failed to calculate for the following reasons:   Cannot find a previous HDL lab   Cannot find a previous total cholesterol lab    Assessment & Plan:  Heartburn -     Famotidine; Take 1 tablet (40 mg total) by mouth daily.  Dispense: 90 tablet; Refill: 0  Class 2 severe obesity due to excess calories with serious comorbidity and body mass index (BMI) of 37.0 to 37.9 in adult (HCC) -     Zepbound; Inject 12.5 mg into the skin once a week.  Dispense: 2 mL; Refill: 0 -     Zepbound; Inject 15 mg into the skin once a week.  Dispense: 2 mL; Refill: 5   Patient has lost 18 pounds since her last visit, she continues with regular exercise and the diet plan I gave her at the last visit. She is doing very well. We will manage her side effects with famotidine, probiotics, and daily  miralax. RTC 3 months.  Return in about 3 months (around 08/28/2023) for weight loss, annual physical exam.    Karie Georges, MD

## 2023-05-28 NOTE — Patient Instructions (Addendum)
Miralax 1 scoop in 8 ounces of water daily  Probiotics up to three times per day

## 2023-05-29 ENCOUNTER — Encounter: Payer: Self-pay | Admitting: Family Medicine

## 2023-06-01 NOTE — Telephone Encounter (Signed)
Noted  

## 2023-06-03 ENCOUNTER — Other Ambulatory Visit (HOSPITAL_COMMUNITY): Payer: Self-pay

## 2023-07-01 ENCOUNTER — Encounter: Payer: Self-pay | Admitting: Family Medicine

## 2023-07-01 ENCOUNTER — Other Ambulatory Visit: Payer: Self-pay | Admitting: Family Medicine

## 2023-08-02 ENCOUNTER — Encounter: Payer: Self-pay | Admitting: Family Medicine

## 2023-08-02 DIAGNOSIS — E66812 Obesity, class 2: Secondary | ICD-10-CM

## 2023-08-02 MED ORDER — ZEPBOUND 10 MG/0.5ML ~~LOC~~ SOAJ: 10.0000 mg | SUBCUTANEOUS | 2 refills | Status: DC

## 2023-08-26 ENCOUNTER — Encounter: Payer: Self-pay | Admitting: Family Medicine

## 2023-08-26 ENCOUNTER — Ambulatory Visit: Payer: No Typology Code available for payment source | Admitting: Family Medicine

## 2023-08-26 VITALS — BP 100/78 | HR 85 | Temp 98.3°F | Ht 63.5 in | Wt 182.1 lb

## 2023-08-26 DIAGNOSIS — E559 Vitamin D deficiency, unspecified: Secondary | ICD-10-CM

## 2023-08-26 DIAGNOSIS — Z1231 Encounter for screening mammogram for malignant neoplasm of breast: Secondary | ICD-10-CM

## 2023-08-26 DIAGNOSIS — Z0000 Encounter for general adult medical examination without abnormal findings: Secondary | ICD-10-CM

## 2023-08-26 DIAGNOSIS — Z13220 Encounter for screening for lipoid disorders: Secondary | ICD-10-CM

## 2023-08-26 DIAGNOSIS — Z1159 Encounter for screening for other viral diseases: Secondary | ICD-10-CM

## 2023-08-26 LAB — LIPID PANEL
Cholesterol: 179 mg/dL (ref 0–200)
HDL: 48.8 mg/dL (ref >39.00)
LDL Cholesterol: 111 mg/dL — ABNORMAL HIGH (ref 0–99)
NonHDL: 130.07
Total CHOL/HDL Ratio: 4
Triglycerides: 93 mg/dL (ref 0.0–149.0)
VLDL: 18.6 mg/dL (ref 0.0–40.0)

## 2023-08-26 LAB — COMPREHENSIVE METABOLIC PANEL
ALT: 6 U/L (ref 0–35)
AST: 14 U/L (ref 0–37)
Albumin: 4.2 g/dL (ref 3.5–5.2)
Alkaline Phosphatase: 46 U/L (ref 39–117)
BUN: 13 mg/dL (ref 6–23)
CO2: 24 meq/L (ref 19–32)
Calcium: 9.3 mg/dL (ref 8.4–10.5)
Chloride: 106 meq/L (ref 96–112)
Creatinine, Ser: 0.72 mg/dL (ref 0.40–1.20)
GFR: 102.07 mL/min (ref >60.00)
Glucose, Bld: 92 mg/dL (ref 70–99)
Potassium: 4.1 meq/L (ref 3.5–5.1)
Sodium: 139 meq/L (ref 135–145)
Total Bilirubin: 0.3 mg/dL (ref 0.2–1.2)
Total Protein: 7.6 g/dL (ref 6.0–8.3)

## 2023-08-26 LAB — CBC WITH DIFFERENTIAL/PLATELET
Basophils Absolute: 0 10*3/uL (ref 0.0–0.1)
Basophils Relative: 0.5 % (ref 0.0–3.0)
Eosinophils Absolute: 0.1 10*3/uL (ref 0.0–0.7)
Eosinophils Relative: 1.3 % (ref 0.0–5.0)
HCT: 38.7 % (ref 36.0–46.0)
Hemoglobin: 12.7 g/dL (ref 12.0–15.0)
Lymphocytes Relative: 28.9 % (ref 12.0–46.0)
Lymphs Abs: 1.6 10*3/uL (ref 0.7–4.0)
MCHC: 32.9 g/dL (ref 30.0–36.0)
MCV: 92.7 fL (ref 78.0–100.0)
Monocytes Absolute: 0.3 10*3/uL (ref 0.1–1.0)
Monocytes Relative: 6.2 % (ref 3.0–12.0)
Neutro Abs: 3.4 10*3/uL (ref 1.4–7.7)
Neutrophils Relative %: 63.1 % (ref 43.0–77.0)
Platelets: 334 10*3/uL (ref 150.0–400.0)
RBC: 4.17 Mil/uL (ref 3.87–5.11)
RDW: 12.6 % (ref 11.5–15.5)
WBC: 5.4 10*3/uL (ref 4.0–10.5)

## 2023-08-26 LAB — VITAMIN D 25 HYDROXY (VIT D DEFICIENCY, FRACTURES): VITD: 63.1 ng/mL (ref 30.00–100.00)

## 2023-08-26 NOTE — Progress Notes (Signed)
Complete physical exam  Patient: Ashley Ware   DOB: November 02, 1980   42 y.o. Female  MRN: [id number]  Subjective:    Chief Complaint  Patient presents with   Annual Exam    Ashley Ware is a 42 y.o. female who presents today for a complete physical exam. She reports consuming a general diet. Home exercise routine includes walking 2 hrs per week. She generally feels well. She reports sleeping fairly well. She does not have additional problems to discuss today.    Most recent fall risk assessment:    05/28/2023    1:32 PM  Fall Risk   Falls in the past year? 0  Number falls in past yr: 0  Injury with Fall? 0  Risk for fall due to : No Fall Risks     Most recent depression screenings:    08/26/2023    8:56 AM 05/28/2023    1:33 PM  PHQ 2/9 Scores  PHQ - 2 Score 0 0  PHQ- 9 Score 3 5    Vision:Not within last year  and no current vision issues,  Dental: patient does not have a regular dentist at the present time, last visit was  a few years ago, no current dental complaints  Patient Active Problem List   Diagnosis Date Noted   Class 2 severe obesity due to excess calories with serious comorbidity and body mass index (BMI) of 37.0 to 37.9 in adult [facility]) 02/25/2023   Mixed hyperlipidemia 02/25/2023   Prehypertension 02/25/2023      Patient Care Team: Karie Georges, MD as PCP - General (Family Medicine)   Outpatient Medications Prior to Visit  Medication Sig   famotidine (PEPCID) 40 MG tablet Take 1 tablet (40 mg total) by mouth daily.   ondansetron (ZOFRAN) 4 MG tablet Take 1 tablet (4 mg total) by mouth every 8 (eight) hours as needed for nausea or vomiting.   Probiotic Product (PROBIOTIC DAILY PO) Take by mouth daily at 12 noon.   tirzepatide (ZEPBOUND) 10 MG/0.5ML Pen Inject 10 mg into the skin once a week.   tirzepatide (ZEPBOUND) 12.5 MG/0.5ML Pen Inject 12.5 mg into the skin once a week.   tirzepatide (ZEPBOUND) 15 MG/0.5ML Pen Inject 15 mg into  the skin once a week.   Vitamin D, Ergocalciferol, (DRISDOL) 1.25 MG (50000 UNIT) CAPS capsule Take 1 capsule (50,000 Units total) by mouth every 7 (seven) days.   No facility-administered medications prior to visit.    Review of Systems  HENT:  Negative for hearing loss.   Eyes:  Negative for blurred vision.  Respiratory:  Negative for shortness of breath.   Cardiovascular:  Negative for chest pain.  Gastrointestinal: Negative.   Genitourinary: Negative.   Musculoskeletal:  Negative for back pain.  Neurological:  Negative for headaches.  Psychiatric/Behavioral:  Negative for depression.        Objective:     BP 100/78 (BP Location: Left Arm, Patient Position: Sitting, Cuff Size: Normal)   Pulse 85   Temp 98.3 F (36.8 C) (Oral)   Ht 5' 3.5" (1.613 m)   Wt 182 lb 1.6 oz (82.6 kg)   LMP 07/13/2023 (Exact Date)   SpO2 98%   BMI 31.75 kg/m    Physical Exam Vitals reviewed.  Constitutional:      Appearance: Normal appearance. She is well-groomed. She is obese.  HENT:     Right Ear: Tympanic membrane and ear canal normal.     Left Ear: Tympanic  membrane and ear canal normal.     Mouth/Throat:     Mouth: Mucous membranes are moist.     Pharynx: No posterior oropharyngeal erythema.  Eyes:     Conjunctiva/sclera: Conjunctivae normal.  Neck:     Thyroid: No thyromegaly.  Cardiovascular:     Rate and Rhythm: Normal rate and regular rhythm.     Pulses: Normal pulses.     Heart sounds: S1 normal and S2 normal.  Pulmonary:     Effort: Pulmonary effort is normal.     Breath sounds: Normal breath sounds and air entry.  Abdominal:     General: Abdomen is flat. Bowel sounds are normal.     Palpations: Abdomen is soft.  Musculoskeletal:     Right lower leg: No edema.     Left lower leg: No edema.  Lymphadenopathy:     Cervical: No cervical adenopathy.  Neurological:     Mental Status: She is alert and oriented to person, place, and time. Mental status is at baseline.      Gait: Gait is intact.  Psychiatric:        Mood and Affect: Mood and affect normal.        Speech: Speech normal.        Behavior: Behavior normal.        Judgment: Judgment normal.      No results found for any visits on 08/26/23.     Assessment & Plan:    Routine Health Maintenance and Physical Exam  Immunization History  Administered Date(s) Administered   Tdap 11/12/2011, 02/25/2020    Health Maintenance  Topic Date Due   Hepatitis C Screening  Never done   Cervical Cancer Screening (HPV/Pap Cotest)  Never done   COVID-19 Vaccine (1 - 2023-24 season) Never done   INFLUENZA VACCINE  01/03/2024 (Originally 05/06/2023)   DTaP/Tdap/Td (3 - Td or Tdap) 02/24/2030   HIV Screening  Completed   HPV VACCINES  Aged Out    Discussed health benefits of physical activity, and encouraged her to engage in regular exercise appropriate for her age and condition.  Need for hepatitis C screening test -     Hepatitis C antibody  Need for lipid screening -     Lipid panel  Routine general medical examination at a health care facility -     Comprehensive metabolic panel -     CBC with Differential/Platelet  Breast cancer screening by mammogram -     3D Screening Mammogram, Left and Right; Future  Vitamin D deficiency -     VITAMIN D 25 Hydroxy (Vit-D Deficiency, Fractures)  Normal physical exam findings, pap deferred due to pt being on her period, will perform pap at her next visit in 3 months. Counseled patient on healthy sleep habits, and advised on getting a regular dentist for her dental care. Handouts given on healthy eating and exercise.   Return in 3 months (on 11/26/2023) for weight loss -- add pap smear to visit.     Karie Georges, MD

## 2023-08-26 NOTE — Patient Instructions (Signed)

## 2023-08-27 ENCOUNTER — Encounter: Payer: Self-pay | Admitting: Family Medicine

## 2023-08-27 LAB — HEPATITIS C ANTIBODY: Hepatitis C Ab: NONREACTIVE

## 2023-10-07 ENCOUNTER — Encounter: Payer: Self-pay | Admitting: Family Medicine

## 2023-10-13 ENCOUNTER — Other Ambulatory Visit: Payer: Self-pay | Admitting: Family Medicine

## 2023-10-13 MED ORDER — ZEPBOUND 12.5 MG/0.5ML ~~LOC~~ SOAJ: 12.5000 mg | SUBCUTANEOUS | 5 refills | Status: DC

## 2023-10-14 ENCOUNTER — Other Ambulatory Visit (HOSPITAL_COMMUNITY): Payer: Self-pay

## 2023-10-14 ENCOUNTER — Telehealth: Payer: Self-pay | Admitting: Pharmacy Technician

## 2023-10-14 NOTE — Telephone Encounter (Signed)
 Pharmacy Patient Advocate Encounter  Received notification from [facility] that Prior Authorization for Zepbound  12.5MG /0.5ML pen-injectors  has been APPROVED from 10/14/2023 to 10/12/2024. Unable to obtain price due to refill too soon rejection, last fill date 10/14/2023 next available fill date01/28/2025

## 2023-10-14 NOTE — Telephone Encounter (Signed)
 PA request has been Submitted. New Encounter created for follow up. For additional info see Pharmacy Prior Auth telephone encounter from 10/14/23.  Current PA is good until 10/29/23, however she has a $160.94 copay per test claim through [facility]- copay amounts may vary at other pharmacies due to pharmacy/plan contracts, or as the patient moves through the different stages of their insurance plan.

## 2023-10-14 NOTE — Telephone Encounter (Signed)
 Pharmacy Patient Advocate Encounter   Received notification from Patient Advice Request messages that prior authorization for Zepbound  12.5mg  is due for renewal.   Insurance verification completed.   The patient is insured through [facility]  Action: PA required; PA submitted to above mentioned insurance via CoverMyMeds Key/confirmation #/EOC [id number] Status is pending  PA Case ID #: [id number]

## 2023-10-15 ENCOUNTER — Encounter: Payer: Self-pay | Admitting: *Deleted

## 2023-10-15 NOTE — Telephone Encounter (Signed)
 Great! Please send pt a message through my chart. I think I have already sent the prescriptions

## 2023-11-02 ENCOUNTER — Other Ambulatory Visit (HOSPITAL_COMMUNITY): Payer: Self-pay

## 2023-11-02 ENCOUNTER — Encounter: Payer: Self-pay | Admitting: *Deleted

## 2023-11-02 NOTE — Telephone Encounter (Signed)
Got notification from the insurance company that in order for this to be further covered through the patient's plan, the patient needs to call [employer] at [phone] and enroll.

## 2023-11-22 ENCOUNTER — Encounter: Payer: Self-pay | Admitting: Family Medicine

## 2023-11-24 ENCOUNTER — Encounter: Payer: Self-pay | Admitting: Family Medicine

## 2023-11-26 ENCOUNTER — Ambulatory Visit: Payer: No Typology Code available for payment source | Admitting: Family Medicine

## 2023-11-26 ENCOUNTER — Other Ambulatory Visit (HOSPITAL_COMMUNITY)
Admission: RE | Admit: 2023-11-26 | Discharge: 2023-11-26 | Disposition: A | Discharge status: Home / Self Care | Payer: No Typology Code available for payment source | Source: Ambulatory Visit | Attending: Family Medicine | Admitting: Family Medicine

## 2023-11-26 VITALS — BP 119/84 | HR 92 | Temp 98.4°F | Ht 63.0 in | Wt 169.3 lb

## 2023-11-26 DIAGNOSIS — Z124 Encounter for screening for malignant neoplasm of cervix: Secondary | ICD-10-CM

## 2023-11-26 DIAGNOSIS — E66812 Obesity, class 2: Secondary | ICD-10-CM

## 2023-11-26 DIAGNOSIS — Z1231 Encounter for screening mammogram for malignant neoplasm of breast: Secondary | ICD-10-CM | POA: Diagnosis not present

## 2023-11-26 DIAGNOSIS — Z6837 Body mass index (BMI) 37.0-37.9, adult: Secondary | ICD-10-CM

## 2023-11-26 MED ORDER — ZEPBOUND 12.5 MG/0.5ML ~~LOC~~ SOAJ: 12.5000 mg | SUBCUTANEOUS | 5 refills | Status: DC

## 2023-11-26 NOTE — Progress Notes (Signed)
   Established Patient Office Visit  Subjective   Patient ID: Ashley Ware, female    DOB: 01-May-1981  Age: 43 y.o. MRN: [id number]  Chief Complaint  Patient presents with   Medical Management of Chronic Issues    3 month weight check and pap today. Pt states menstruals are more frequent and short. Per pt she has 2 cycles a month.     Pt is here for weight loss follow up, she is doing very well on the 12.5 mg dose of zepbound. States that there are no side effects to the medication. Has lost 13 pounds since her last visit. Pt is due for her pap smear today.     Current Outpatient Medications  Medication Instructions   famotidine (PEPCID) 40 mg, Oral, Daily   ondansetron (ZOFRAN) 4 mg, Oral, Every 8 hours PRN   Probiotic Product (PROBIOTIC DAILY PO) Daily   Vitamin D (Ergocalciferol) (DRISDOL) 50,000 Units, Oral, Every 7 days   Zepbound 12.5 mg, Subcutaneous, Weekly    Patient Active Problem List   Diagnosis Date Noted   Class 2 severe obesity due to excess calories with serious comorbidity and body mass index (BMI) of 37.0 to 37.9 in adult [facility]) 02/25/2023   Mixed hyperlipidemia 02/25/2023   Prehypertension 02/25/2023      Review of Systems  All other systems reviewed and are negative.     Objective:     BP 119/84 (BP Location: Right Arm, Patient Position: Sitting)   Pulse 92   Temp 98.4 F (36.9 C)   Ht 5\' 3"  (1.6 m)   Wt 169 lb 4.8 oz (76.8 kg)   LMP 11/04/2023   SpO2 96%   BMI 29.99 kg/m    Physical Exam Vitals reviewed. Exam conducted with a chaperone present.  Constitutional:      Appearance: Normal appearance. She is overweight.  Genitourinary:    General: Normal vulva.     Exam position: Lithotomy position.     Labia:        Right: No rash.        Left: No rash.      Vagina: Normal.     Cervix: Normal.     Uterus: Normal.   Lymphadenopathy:     Lower Body: No right inguinal adenopathy. No left inguinal adenopathy.  Neurological:      Mental Status: She is alert.      No results found for any visits on 11/26/23.    The 10-year ASCVD risk score (Arnett DK, et al., 2019) is: 0.7%    Assessment & Plan:  Breast cancer screening by mammogram -     3D Screening Mammogram, Left and Right; Future  Class 2 severe obesity due to excess calories with serious comorbidity and body mass index (BMI) of 37.0 to 37.9 in adult (HCC) -     Zepbound; Inject 12.5 mg into the skin once a week.  Dispense: 2 mL; Refill: 5  Encounter for Papanicolaou smear for cervical cancer screening -     Cytology - PAP   Pt is doing well on the zepbound, is having some difficulty with the prior authorization, will send new rx to initiate a new prior auth. Pap smear done and sent to lab.   Return in about 6 months (around 05/25/2024) for weight loss.    Karie Georges, MD

## 2023-11-29 ENCOUNTER — Encounter: Payer: Self-pay | Admitting: Family Medicine

## 2023-11-29 LAB — CYTOLOGY - PAP
Comment: NEGATIVE
Diagnosis: NEGATIVE
High risk HPV: NEGATIVE

## 2023-11-29 NOTE — Telephone Encounter (Signed)
 See prior Mychart message.

## 2023-11-29 NOTE — Telephone Encounter (Signed)
 See prior note sent to PA team

## 2023-11-30 ENCOUNTER — Encounter: Payer: Self-pay | Admitting: Family Medicine

## 2023-12-03 ENCOUNTER — Encounter: Payer: Self-pay | Admitting: Family Medicine

## 2023-12-03 NOTE — Telephone Encounter (Signed)
 Can we find out where she is in the authorization process?

## 2023-12-06 ENCOUNTER — Encounter: Payer: Self-pay | Admitting: Family Medicine

## 2023-12-09 ENCOUNTER — Telehealth: Payer: Self-pay

## 2023-12-09 ENCOUNTER — Telehealth: Payer: Self-pay | Admitting: Family Medicine

## 2023-12-09 ENCOUNTER — Other Ambulatory Visit (HOSPITAL_COMMUNITY): Payer: Self-pay

## 2023-12-09 NOTE — Telephone Encounter (Signed)
 Left a detailed message with the approval information below on the voicemail at [facility].

## 2023-12-09 NOTE — Telephone Encounter (Signed)
 Pharmacy Patient Advocate Encounter  Received notification from [facility] that Prior Authorization for Zepbound 12.5MG /0.5ML pen-injectors has been APPROVED from 12/09/23 to 12/08/24. Ran test claim, Copay is $160.94. This test claim was processed through [facility]- copay amounts may vary at other pharmacies due to pharmacy/plan contracts, or as the patient moves through the different stages of their insurance plan.   PA #/Case ID/Reference #: [id number]

## 2023-12-09 NOTE — Telephone Encounter (Signed)
 Copied from CRM [id number]. Topic: Clinical - Prescription Issue >> Dec 09, 2023 12:46 PM Eunice Blase wrote: Reason for EAV:[id number] call from [facility] per Angela ph: [phone], need prior authorization for tirzepatide (ZEPBOUND) 12.5 MG/0.5ML Pen.

## 2023-12-09 NOTE — Telephone Encounter (Signed)
 PA request has been Submitted. New Encounter has been or will be created for follow up. For additional info see Pharmacy Prior Auth telephone encounter from 12/09/23.

## 2023-12-09 NOTE — Telephone Encounter (Signed)
 Pharmacy Patient Advocate Encounter   Received notification from Pt Calls Messages that prior authorization for Zepbound 12.5MG /0.5ML pen-injectors is required/requested.   Insurance verification completed.   The patient is insured through [facility] .   Per test claim: PA required; PA submitted to above mentioned insurance via CoverMyMeds Key/confirmation #/EOC [id number] Status is pending

## 2023-12-13 NOTE — Telephone Encounter (Signed)
 Message left on pharmacy voicemail-see phone call from 12/09/2023.

## 2023-12-28 ENCOUNTER — Encounter: Payer: Self-pay | Admitting: Family Medicine

## 2024-01-28 ENCOUNTER — Encounter: Payer: Self-pay | Admitting: Family Medicine

## 2024-01-28 DIAGNOSIS — E66812 Obesity, class 2: Secondary | ICD-10-CM

## 2024-02-03 ENCOUNTER — Other Ambulatory Visit: Payer: Self-pay | Admitting: Family Medicine

## 2024-02-03 DIAGNOSIS — E66812 Obesity, class 2: Secondary | ICD-10-CM

## 2024-02-03 MED ORDER — ZEPBOUND 12.5 MG/0.5ML ~~LOC~~ SOAJ: 12.5000 mg | SUBCUTANEOUS | 3 refills | Status: DC

## 2024-02-03 MED ORDER — ZEPBOUND 15 MG/0.5ML ~~LOC~~ SOAJ: 15.0000 mg | SUBCUTANEOUS | 5 refills | Status: DC

## 2024-02-04 ENCOUNTER — Encounter: Payer: Self-pay | Admitting: Family Medicine

## 2024-02-04 DIAGNOSIS — E66812 Obesity, class 2: Secondary | ICD-10-CM

## 2024-02-05 MED ORDER — ZEPBOUND 12.5 MG/0.5ML ~~LOC~~ SOAJ: 12.5000 mg | SUBCUTANEOUS | 0 refills | Status: DC

## 2024-02-07 ENCOUNTER — Telehealth: Payer: Self-pay | Admitting: Pharmacy Technician

## 2024-02-07 ENCOUNTER — Other Ambulatory Visit (HOSPITAL_COMMUNITY): Payer: Self-pay

## 2024-02-07 NOTE — Telephone Encounter (Signed)
 Pharmacy Patient Advocate Encounter   Received notification from CoverMyMeds that prior authorization for Zepbound  12.5MG /0.5ML pen-injectors is required/requested.   Insurance verification completed.   The patient is insured through [facility] .   Per test claim: Refill too soon. PA is not needed at this time. Medication was filled 02/03/24. Next eligible fill date is 04/06/24.

## 2024-02-07 NOTE — Telephone Encounter (Signed)
 Mychart message sent to the patient .

## 2024-02-11 ENCOUNTER — Other Ambulatory Visit (HOSPITAL_COMMUNITY): Payer: Self-pay

## 2024-03-01 MED ORDER — ZEPBOUND 15 MG/0.5ML ~~LOC~~ SOAJ: 15.0000 mg | SUBCUTANEOUS | 5 refills | Status: DC

## 2024-03-02 ENCOUNTER — Telehealth: Payer: Self-pay

## 2024-03-02 ENCOUNTER — Telehealth: Payer: Self-pay | Admitting: *Deleted

## 2024-03-02 MED ORDER — ZEPBOUND 15 MG/0.5ML ~~LOC~~ SOAJ: 15.0000 mg | SUBCUTANEOUS | 5 refills | Status: DC

## 2024-03-02 NOTE — Telephone Encounter (Signed)
 See prior phone note.

## 2024-03-02 NOTE — Telephone Encounter (Signed)
 Copied from CRM [id number]. Topic: Clinical - Medical Advice >> Mar 02, 2024 10:16 AM Ashley Ware wrote: Reason for CRM: Ashley Ware from [employer] stated that zepbound   Will be taken of the list as if 07/01, patients needs a Medical exception form faxed over and pre-cert team [phone] and fax  # [phone]

## 2024-03-02 NOTE — Telephone Encounter (Signed)
 Pharmacy Patient Advocate Encounter   Received notification from Pt Calls Messages that prior authorization for Zepbound  is required/requested.   Insurance verification completed.   The patient is insured through [facility] ADVANT[employer]/RX ADVANCE . Patient has quantity limit of 6 per 63 days.   Per test claim: Refill too soon. PA is not needed at this time. Medication was filled 02/03/24. Next eligible fill date is 04/06/24.

## 2024-03-03 NOTE — Telephone Encounter (Signed)
 Ok to close, duplicate

## 2024-03-03 NOTE — Telephone Encounter (Signed)
 Please let patient know that they will not allow her to fill the 15 mg because she is not due for a fill until 7/3-- she will jsut need to stay on the 12.5 mg.

## 2024-03-07 ENCOUNTER — Encounter: Payer: Self-pay | Admitting: Family Medicine

## 2024-03-07 MED ORDER — ZEPBOUND 12.5 MG/0.5ML ~~LOC~~ SOAJ: 12.5000 mg | SUBCUTANEOUS | 0 refills | Status: DC

## 2024-03-07 NOTE — Addendum Note (Signed)
 Addended by: Damita Eppard M on: 03/07/2024 10:20 AM   Modules accepted: Orders

## 2024-03-15 ENCOUNTER — Telehealth: Payer: Self-pay

## 2024-03-15 ENCOUNTER — Other Ambulatory Visit (HOSPITAL_COMMUNITY): Payer: Self-pay

## 2024-03-15 NOTE — Telephone Encounter (Signed)
 Pharmacy Patient Advocate Encounter   Received notification from Patient Pharmacy that prior authorization for Zepbound  12.5 is required/requested.   Insurance verification completed.   The patient is insured through [facility] .   Per test claim: please see encounter 03/02/24. Patient next fill 04/06/24

## 2024-03-22 ENCOUNTER — Other Ambulatory Visit (HOSPITAL_COMMUNITY): Payer: Self-pay

## 2024-03-23 ENCOUNTER — Encounter: Payer: Self-pay | Admitting: Family Medicine

## 2024-03-23 NOTE — Telephone Encounter (Signed)
 Would you like for me to forward this to the prior auth team?

## 2024-03-23 NOTE — Telephone Encounter (Signed)
 Yes please send to the prior auth team

## 2024-04-04 ENCOUNTER — Encounter: Payer: Self-pay | Admitting: Family Medicine

## 2024-04-04 DIAGNOSIS — E66812 Obesity, class 2: Secondary | ICD-10-CM

## 2024-04-04 MED ORDER — WEGOVY 1.7 MG/0.75ML ~~LOC~~ SOAJ: 1.7000 mg | SUBCUTANEOUS | 3 refills | Status: DC

## 2024-04-05 ENCOUNTER — Telehealth: Payer: Self-pay

## 2024-04-05 ENCOUNTER — Other Ambulatory Visit: Payer: Self-pay | Admitting: Family Medicine

## 2024-04-05 ENCOUNTER — Other Ambulatory Visit (HOSPITAL_COMMUNITY): Payer: Self-pay

## 2024-04-05 NOTE — Telephone Encounter (Signed)
 Pharmacy Patient Advocate Encounter   Received notification from CoverMyMeds that prior authorization for Wegovy 1.7 is required/requested.   Insurance verification completed.   The patient is insured through [facility] .   Per test claim: PA required; PA submitted to above mentioned insurance via CoverMyMeds Key/confirmation #/EOC [id number] Status is pending

## 2024-04-10 ENCOUNTER — Telehealth: Payer: Self-pay

## 2024-04-10 ENCOUNTER — Other Ambulatory Visit (HOSPITAL_COMMUNITY): Payer: Self-pay

## 2024-04-10 NOTE — Telephone Encounter (Signed)
 Resubmitted with wegovy  fail noted.  Pharmacy Patient Advocate Encounter   Received notification from Pt Calls Messages that prior authorization for Wegovy  1.7 is required/requested.   Insurance verification completed.   The patient is insured through [facility] .   Per test claim: PA required; PA started via CoverMyMeds. KEY [id number] . Waiting for clinical questions to populate.

## 2024-04-10 NOTE — Telephone Encounter (Signed)
 Pharmacy Patient Advocate Encounter  Received notification from [facility] that Prior Authorization for Zepbound  has been CANCELLED due to patient must contact [facility] through plan.   PA #/Case ID/Reference #: [id number]

## 2024-04-10 NOTE — Telephone Encounter (Signed)
 Clinical questions have been answered and PA submitted. PA currently Pending. Please be advised that most companies allow up to 30 days to make a decision. We will advise when a determination has been made, or follow up in 1 week.   Please reach out to our team, Rx Prior Auth Pool, if you haven't heard back in a week.

## 2024-04-11 NOTE — Telephone Encounter (Signed)
 Her PBM prefers wegovy  now-- I sent in an rx for her on 7/1 for the weogvy 1.7 mg which is the equivalent dose to the zepbound  she was on previously

## 2024-04-13 ENCOUNTER — Encounter: Payer: Self-pay | Admitting: Family Medicine

## 2024-04-17 ENCOUNTER — Other Ambulatory Visit (HOSPITAL_COMMUNITY): Payer: Self-pay

## 2024-05-02 ENCOUNTER — Other Ambulatory Visit (HOSPITAL_COMMUNITY): Payer: Self-pay

## 2024-05-14 MED ORDER — ZEPBOUND 12.5 MG/0.5ML ~~LOC~~ SOAJ: 12.5000 mg | SUBCUTANEOUS | 5 refills | Status: DC

## 2024-05-17 ENCOUNTER — Telehealth: Payer: Self-pay

## 2024-05-17 ENCOUNTER — Telehealth: Payer: Self-pay | Admitting: Family Medicine

## 2024-05-17 ENCOUNTER — Other Ambulatory Visit (HOSPITAL_COMMUNITY): Payer: Self-pay

## 2024-05-17 NOTE — Telephone Encounter (Signed)
 Copied from CRM #[id number]. Topic: Clinical - Medication Prior Auth >> May 17, 2024  1:57 PM Ismael A wrote: Reason for CRM: received call from Maryann with [employer] [employer] - she stated [place] is no longer covering zepbound  and they cover wegovy  instead but patient states she has side effects to wegovy . Stacy is asking if Dr. Ozell can send an appeal, fax for appeal [phone] - asked to follow up with patient to provide updates on appeal

## 2024-05-17 NOTE — Telephone Encounter (Signed)
 Pharmacy Patient Advocate Encounter  Received notification from [facility] that Prior Authorization for Zepbound  12.5 has been DENIED.  See denial reason below. No denial letter attached in CMM. Will attach denial letter to Media tab once received.   PA #/Case ID/Reference #: [id number]

## 2024-05-17 NOTE — Telephone Encounter (Signed)
 Pharmacy Patient Advocate Encounter   Received notification from CoverMyMeds that prior authorization for Zepbound  12.5 is required/requested.   Insurance verification completed.   The patient is insured through [facility] .   Per test claim: PA required; PA submitted to above mentioned insurance via Latent Key/confirmation #/EOC [id number] Status is pending

## 2024-05-18 ENCOUNTER — Other Ambulatory Visit (HOSPITAL_COMMUNITY): Payer: Self-pay

## 2024-05-18 NOTE — Telephone Encounter (Signed)
 Thank you for clarifying, I will have patient schedule a visit.

## 2024-05-18 NOTE — Telephone Encounter (Signed)
 Good morning. The long string of patient messages that are in this thread have documented that she has had severe side effects to the wegovy  and she has trialed the medication and failed it. Does this need to be in a face to face visit or are the patient messages good enough for documentation?

## 2024-05-19 ENCOUNTER — Telehealth: Admitting: Family Medicine

## 2024-05-19 ENCOUNTER — Encounter: Payer: Self-pay | Admitting: Family Medicine

## 2024-05-19 VITALS — BP 108/60 | HR 83

## 2024-05-19 DIAGNOSIS — Z6837 Body mass index (BMI) 37.0-37.9, adult: Secondary | ICD-10-CM

## 2024-05-19 DIAGNOSIS — E66812 Obesity, class 2: Secondary | ICD-10-CM | POA: Diagnosis not present

## 2024-05-19 MED ORDER — ZEPBOUND 12.5 MG/0.5ML ~~LOC~~ SOAJ
12.5000 mg | SUBCUTANEOUS | 5 refills | Status: DC
Start: 2024-05-19 — End: 2024-05-26

## 2024-05-19 NOTE — Assessment & Plan Note (Addendum)
 Patient has had SEVERE side effects with Wegovy  -- severe N/V/D and abdominal pain, hypotension and had an episode of syncope while on wegovy . Her blood pressure also remains somewhat low despite her efforts to drink more water and consume more salt.   Treatment failure also because patient is now gaining weight back, has gained 6 pounds since she started the wegovy . States that staying on the medication would not be feasible due to the side effects she is experiencing. Will document everything in the chart and will try again to get the Zepbound  back for her.

## 2024-05-19 NOTE — Progress Notes (Signed)
 Virtual Medical Office Visit  Patient:  Ashley Ware      Age: 43 y.o.       Sex:  female  Date:   05/19/2024  PCP:    Ozell Heron HERO, MD   Today's Healthcare Provider: Heron HERO Ozell, MD    Assessment/Plan:   Summary assessment:  Ashley Ware was seen today for medication problem, atrial fibrillation and neck pain.  Class 2 severe obesity due to excess calories with serious comorbidity and body mass index (BMI) of 37.0 to 37.9 in adult [facility]) Assessment & Plan: Patient has had SEVERE side effects with Wegovy  -- severe N/V/D and abdominal pain, hypotension and had an episode of syncope while on wegovy . Her blood pressure also remains somewhat low despite her efforts to drink more water and consume more salt.   Treatment failure also because patient is now gaining weight back, has gained 6 pounds since she started the wegovy . States that staying on the medication would not be feasible due to the side effects she is experiencing. Will document everything in the chart and will try again to get the Zepbound  back for her.   Orders: -     Zepbound ; Inject 12.5 mg into the skin once a week.  Dispense: 2 mL; Refill: 5     No follow-ups on file.   She was advised to call the office or go to ER if her condition worsens    Subjective:   Ashley Ware is a 43 y.o. female with PMH significant for: Past Medical History:  Diagnosis Date   Preterm labor      Presenting today with: Chief Complaint  Patient presents with   Medication Problem   Atrial Fibrillation    Patient complains of nausea, abdominal cramping after each meal, syncope occurred last week, BP readings of systolic 100s-diastolic 60s, fell, hit her head and did not seek ER treatment   Neck Pain     She clarifies and reports that her condition: Patient states that ever since she started the Wegovy  she has been having really bad pains under her chest at the top of the stomach every time she eats. She has been  vomiting on occasion as well. She is reporting severe headaches causing nausea. States that even the ondansetron  is not effective in helping with the nausea. The BM's are basically straight diarrhea. Very watery. Now blood pressure is down and she had an episode of passing out. Is able to drink water, is also using electrolyte powders to help with the low blood pressure.  Since starting the Wegovy  she has actually gained weight, was down to 149 on the Zepbound , now is back up to 156. States that it is not  BP today at home was 106/60. Pulse was 89.         Objective/Observations  Physical Exam:  Polite and friendly Gen: NAD, resting comfortably Pulm: Normal work of breathing Neuro: Grossly normal, moves all extremities Psych: Normal affect and thought content Problem specific physical exam findings:    No images are attached to the encounter or orders placed in the encounter.    Results: No results found for any visits on 05/19/24.   No results found for this or any previous visit (from the past 2160 hours).         Virtual Visit via Video   I connected with Ashley Ware on 05/19/24 at  2:30 PM EDT by a video enabled telemedicine application and verified that I  am speaking with the correct person using two identifiers. The limitations of evaluation and management by telemedicine and the availability of in person appointments were discussed. The patient expressed understanding and agreed to proceed.   Percentage of appointment time on video:  100% Patient location: Home Provider location: [place] [facility] Persons participating in the virtual visit: Myself and Patient

## 2024-05-23 ENCOUNTER — Encounter: Payer: Self-pay | Admitting: Family Medicine

## 2024-05-24 ENCOUNTER — Other Ambulatory Visit (HOSPITAL_COMMUNITY): Payer: Self-pay

## 2024-05-25 ENCOUNTER — Other Ambulatory Visit (HOSPITAL_COMMUNITY): Payer: Self-pay

## 2024-05-25 ENCOUNTER — Telehealth: Payer: Self-pay

## 2024-05-25 NOTE — Telephone Encounter (Signed)
 Noted

## 2024-05-25 NOTE — Telephone Encounter (Signed)
 Pharmacy Patient Advocate Encounter  Received notification from [facility] that Prior Authorization for Zepbound   has been DENIED.  Full denial letter will be uploaded to the media tab. See denial reason below.   PA #/Case ID/Reference #: [id number]

## 2024-05-25 NOTE — Telephone Encounter (Signed)
 Pharmacy Patient Advocate Encounter   Received notification from Pt Calls Messages that prior authorization for Zepbound  12.5 is required/requested.   Insurance verification completed.   The patient is insured through [facility] .   Per test claim: PA required; PA submitted to above mentioned insurance via Latent Key/confirmation #/EOC [id number] Status is pending

## 2024-05-26 ENCOUNTER — Telehealth: Payer: Self-pay | Admitting: Pharmacist

## 2024-05-26 ENCOUNTER — Encounter: Payer: Self-pay | Admitting: Family Medicine

## 2024-05-26 ENCOUNTER — Telehealth: Payer: Self-pay

## 2024-05-26 ENCOUNTER — Ambulatory Visit: Payer: No Typology Code available for payment source | Admitting: Family Medicine

## 2024-05-26 ENCOUNTER — Other Ambulatory Visit (HOSPITAL_COMMUNITY): Payer: Self-pay

## 2024-05-26 VITALS — BP 92/60 | HR 75 | Temp 98.5°F | Ht 63.0 in | Wt 154.2 lb

## 2024-05-26 DIAGNOSIS — E66812 Obesity, class 2: Secondary | ICD-10-CM

## 2024-05-26 DIAGNOSIS — R109 Unspecified abdominal pain: Secondary | ICD-10-CM | POA: Diagnosis not present

## 2024-05-26 MED ORDER — DICYCLOMINE HCL 10 MG PO CAPS: 10.0000 mg | ORAL_CAPSULE | Freq: Three times a day (TID) | ORAL | 2 refills | Status: AC

## 2024-05-26 NOTE — Telephone Encounter (Signed)
 Information has been sent to clinical pharmacist for appeals review. It may take 5-7 days to prepare the necessary documentation to request the appeal from the insurance.

## 2024-05-26 NOTE — Telephone Encounter (Signed)
 Clinical questions have been answered and PA submitted. PA currently Pending. Please be advised that most companies allow up to 30 days to make a decision. We will advise when a determination has been made, or follow up in 1 week.   Please reach out to our team, Rx Prior Auth Pool, if you haven't heard back in a week.

## 2024-05-26 NOTE — Telephone Encounter (Signed)
 Chatted with Ashley Ware, they are working on the appeal for me.

## 2024-05-26 NOTE — Telephone Encounter (Signed)
 Please have the prior auth team send the video visit note that I completed on 05/19/2024. This has the documentation that she tried and failed the wegovy 

## 2024-05-26 NOTE — Telephone Encounter (Signed)
 Appeal has been submitted for Zepbound . Will advise when response is received or follow up in 1 week. Please be advised that most companies may take 30 days to make a decision. Appeal letter and supporting documentation have been faxed to [phone] on 05/26/2024 @12 :59 pm.  Thank you, Devere Pandy, PharmD Clinical Pharmacist  [place]  Direct Dial: [phone]

## 2024-05-26 NOTE — Telephone Encounter (Signed)
 Pharmacy Patient Advocate Encounter  Received notification from [facility] that Prior Authorization for Mounjaro has been DENIED.  See denial reason below. No denial letter attached in CMM. Will attach denial letter to Media tab once received. Coverage applies only for type 2 diabetes; requested use for a non-approved condition does not meet criteria.   PA #/Case ID/Reference #: [id number]    I will get an appeal letter written for Zepbound  and submitted to the insurance today.  This will be documented in a separate telephone encounter.  Thank you, Devere Pandy, PharmD Clinical Pharmacist  [place]  Direct Dial: [phone]

## 2024-05-26 NOTE — Telephone Encounter (Signed)
 See prior phone note from PA team in which stated notes would be sent.

## 2024-05-26 NOTE — Telephone Encounter (Signed)
 Copied from CRM [id number]. Topic: Clinical - Prescription Issue >> May 26, 2024 10:06 AM Adelita E wrote: Reason for CRM: Glennie with [employer] called in regarding the PA denial of patient's tirzepatide  (ZEPBOUND ) 12.5 MG/0.5ML Pen. Representative stated that they would need proof that alternative medications did not work for this patient. They would also need to know the medical necessity of this medication and if it is solely for weight loss or if there are other issues that it will treat. Representative provided a phone number [employer] which is [phone].

## 2024-05-26 NOTE — Telephone Encounter (Signed)
 Pharmacy Patient Advocate Encounter   Received notification from Physician's Office that prior authorization for Mounjaro 12.5 mg is required/requested.   Insurance verification completed.   The patient is insured through [facility] .   Per test claim: PA required; PA started via CoverMyMeds. KEY [id number] . Waiting for clinical questions to populate.

## 2024-05-26 NOTE — Progress Notes (Signed)
   Established Patient Office Visit  Subjective   Patient ID: Ashley Ware, female    DOB: 23-Feb-1981  Age: 43 y.o. MRN: [id number]  Chief Complaint  Patient presents with   Medical Management of Chronic Issues    Pt is here for follow up today for weight loss. She reports continues stomach cramps with wegovy  use,  hypotension, no further passing out episodes. She is drinking copious amounts of fluids, is also adding salt to her fluids. She reports that her ankles are more swollen now due to all of the fluids she is drinking. BP today was reviewed.     Current Outpatient Medications  Medication Instructions   dicyclomine  (BENTYL ) 10 mg, Oral, 3 times daily before meals & bedtime   famotidine  (PEPCID ) 40 mg, Oral, Daily   IBUPROFEN  PO As needed   ondansetron  (ZOFRAN ) 4 mg, Oral, Every 8 hours PRN   OVER THE COUNTER MEDICATION Vitamin D  with K2-every other day    Patient Active Problem List   Diagnosis Date Noted   Class 2 severe obesity due to excess calories with serious comorbidity and body mass index (BMI) of 37.0 to 37.9 in adult [facility]) 02/25/2023   Mixed hyperlipidemia 02/25/2023   Prehypertension 02/25/2023      Review of Systems  All other systems reviewed and are negative.     Objective:     BP 92/60   Pulse 75   Temp 98.5 F (36.9 C) (Oral)   Ht 5' 3 (1.6 m)   Wt 154 lb 3.2 oz (69.9 kg)   LMP 04/24/2024 (Exact Date)   SpO2 100%   BMI 27.32 kg/m    Physical Exam Vitals reviewed.  Constitutional:      Appearance: Normal appearance. She is normal weight.  Cardiovascular:     Rate and Rhythm: Normal rate and regular rhythm.     Heart sounds: Normal heart sounds.  Pulmonary:     Effort: Pulmonary effort is normal.     Breath sounds: Normal breath sounds.  Musculoskeletal:     Right lower leg: Edema (trace BL ankles) present.  Neurological:     Mental Status: She is alert and oriented to person, place, and time.  Psychiatric:        Mood and  Affect: Mood normal.        Behavior: Behavior normal.      No results found for any visits on 05/26/24.    The 10-year ASCVD risk score (Arnett DK, et al., 2019) is: 0.4%    Assessment & Plan:  Stomach cramps -     Dicyclomine  HCl; Take 1 capsule (10 mg total) by mouth 4 (four) times daily -  before meals and at bedtime.  Dispense: 20 capsule; Refill: 2   We have submitted for an appeal to her insurance company for the zepbound  since she has failed the wegovy , had severe side effects, will treat with dicyclomine  PRN until the zepbound  is approved.   Return in about 6 months (around 11/26/2024) for annual physical exam.    Heron CHRISTELLA Sharper, MD

## 2024-05-29 ENCOUNTER — Telehealth: Payer: Self-pay

## 2024-05-29 ENCOUNTER — Encounter: Payer: Self-pay | Admitting: Family Medicine

## 2024-05-29 ENCOUNTER — Other Ambulatory Visit (HOSPITAL_COMMUNITY): Payer: Self-pay

## 2024-05-29 NOTE — Telephone Encounter (Signed)
  isa from [employer] stated that Prior Authorization for Mounjaro  has been approved and they need it sent to pharmacy. It needs to go to [facility] on [address] Please call patient with an update.    Approval # [id number]

## 2024-05-30 ENCOUNTER — Other Ambulatory Visit (HOSPITAL_COMMUNITY): Payer: Self-pay

## 2024-05-30 ENCOUNTER — Telehealth: Payer: Self-pay

## 2024-05-30 MED ORDER — TIRZEPATIDE 12.5 MG/0.5ML ~~LOC~~ SOAJ: 12.5000 mg | SUBCUTANEOUS | 5 refills | Status: AC

## 2024-05-30 MED ORDER — ZEPBOUND 12.5 MG/0.5ML ~~LOC~~ SOAJ: 12.5000 mg | SUBCUTANEOUS | 5 refills | Status: DC

## 2024-05-30 NOTE — Telephone Encounter (Signed)
 See prior Mychart message from PCP to patient.

## 2024-05-30 NOTE — Telephone Encounter (Signed)
 Copied from CRM [id number]. Topic: Clinical - Medication Prior Auth >> May 30, 2024 10:41 AM Zy'onna H wrote: Reason for CRM:  Patient called in stating the current Prior -Authorization is in for incorrect medication - Tirzepatide  (ZEPBOUND ) 12.5 MG/0.5ML Pen  Patient stated her insurance has sent over the Authorization for: Monjaro instead and the Authorization requires Ozell Heron HERO, MD approval.   Patient is out of the medication,and the time for new injection is coming up - please send it ASAP. Patient stated [facility]  - gave the clinic a call yesterday to ensure the approval of the medication [place]) *See in Chart Review*  PCP/PCP Team Please Advise.

## 2024-05-30 NOTE — Telephone Encounter (Signed)
 [facility] has approved the use of Mounjaro  for this patient.  The appeal is effected 05/26/2024 through 01/24/2025.  Full approval letter has been uploaded to the media tab.    Thank you, Devere Pandy, PharmD Clinical Pharmacist  [place]  Direct Dial: [phone]

## 2024-06-06 ENCOUNTER — Other Ambulatory Visit (HOSPITAL_COMMUNITY): Payer: Self-pay

## 2024-06-07 ENCOUNTER — Other Ambulatory Visit (HOSPITAL_COMMUNITY): Payer: Self-pay

## 2024-09-11 ENCOUNTER — Encounter: Payer: Self-pay | Admitting: Family Medicine

## 2024-09-11 DIAGNOSIS — R12 Heartburn: Secondary | ICD-10-CM

## 2024-09-12 MED ORDER — FAMOTIDINE 40 MG PO TABS: 40.0000 mg | ORAL_TABLET | Freq: Every day | ORAL | 0 refills | Status: AC

## 2024-11-17 ENCOUNTER — Encounter: Admitting: Family Medicine
